# Patient Record
Sex: Female | Born: 1950
Health system: Southern US, Community
[De-identification: ages and names within clinical notes are randomized; demographics above are authoritative.]

## PROBLEM LIST (undated history)

## (undated) DIAGNOSIS — F419 Anxiety disorder, unspecified: Secondary | ICD-10-CM

## (undated) DIAGNOSIS — C50919 Malignant neoplasm of unspecified site of unspecified female breast: Secondary | ICD-10-CM

## (undated) DIAGNOSIS — K219 Gastro-esophageal reflux disease without esophagitis: Secondary | ICD-10-CM

## (undated) DIAGNOSIS — K635 Polyp of colon: Secondary | ICD-10-CM

## (undated) DIAGNOSIS — J189 Pneumonia, unspecified organism: Secondary | ICD-10-CM

## (undated) DIAGNOSIS — F329 Major depressive disorder, single episode, unspecified: Secondary | ICD-10-CM

## (undated) DIAGNOSIS — F32A Depression, unspecified: Secondary | ICD-10-CM

## (undated) DIAGNOSIS — I491 Atrial premature depolarization: Secondary | ICD-10-CM

## (undated) HISTORY — DX: Depression, unspecified: F32.A

## (undated) HISTORY — DX: Anxiety disorder, unspecified: F41.9

## (undated) HISTORY — DX: Malignant neoplasm of unspecified site of unspecified female breast: C50.919

## (undated) HISTORY — PX: GASTRIC FUNDOPLICATION: SHX226

## (undated) HISTORY — DX: Pneumonia, unspecified organism: J18.9

## (undated) HISTORY — DX: Polyp of colon: K63.5

## (undated) HISTORY — DX: Major depressive disorder, single episode, unspecified: F32.9

## (undated) HISTORY — DX: Gastro-esophageal reflux disease without esophagitis: K21.9

## (undated) HISTORY — DX: Atrial premature depolarization: I49.1

---

## 1998-05-21 ENCOUNTER — Ambulatory Visit (HOSPITAL_COMMUNITY): Admission: RE | Admit: 1998-05-21 | Discharge: 1998-05-21 | Payer: Self-pay | Admitting: Gastroenterology

## 1998-05-28 ENCOUNTER — Ambulatory Visit (HOSPITAL_COMMUNITY): Admission: RE | Admit: 1998-05-28 | Discharge: 1998-05-28 | Payer: Self-pay | Admitting: Gastroenterology

## 1998-07-31 ENCOUNTER — Ambulatory Visit (HOSPITAL_COMMUNITY): Admission: RE | Admit: 1998-07-31 | Discharge: 1998-07-31 | Payer: Self-pay | Admitting: Gastroenterology

## 1998-07-31 ENCOUNTER — Encounter: Payer: Self-pay | Admitting: Gastroenterology

## 1999-02-22 ENCOUNTER — Encounter: Payer: Self-pay | Admitting: Gastroenterology

## 1999-02-22 ENCOUNTER — Ambulatory Visit (HOSPITAL_COMMUNITY): Admission: RE | Admit: 1999-02-22 | Discharge: 1999-02-22 | Payer: Self-pay | Admitting: Gastroenterology

## 1999-06-01 ENCOUNTER — Encounter: Payer: Self-pay | Admitting: Gastroenterology

## 1999-06-01 ENCOUNTER — Ambulatory Visit (HOSPITAL_COMMUNITY): Admission: RE | Admit: 1999-06-01 | Discharge: 1999-06-01 | Payer: Self-pay | Admitting: Gastroenterology

## 1999-08-20 ENCOUNTER — Encounter: Payer: Self-pay | Admitting: Gastroenterology

## 1999-08-20 ENCOUNTER — Ambulatory Visit (HOSPITAL_COMMUNITY): Admission: RE | Admit: 1999-08-20 | Discharge: 1999-08-20 | Payer: Self-pay | Admitting: Gastroenterology

## 2000-03-21 ENCOUNTER — Encounter: Payer: Self-pay | Admitting: Gastroenterology

## 2000-03-21 ENCOUNTER — Ambulatory Visit (HOSPITAL_COMMUNITY): Admission: RE | Admit: 2000-03-21 | Discharge: 2000-03-21 | Payer: Self-pay | Admitting: Gastroenterology

## 2000-10-24 ENCOUNTER — Ambulatory Visit (HOSPITAL_COMMUNITY): Admission: RE | Admit: 2000-10-24 | Discharge: 2000-10-24 | Payer: Self-pay | Admitting: *Deleted

## 2000-10-24 ENCOUNTER — Encounter: Payer: Self-pay | Admitting: *Deleted

## 2000-12-22 ENCOUNTER — Other Ambulatory Visit: Admission: RE | Admit: 2000-12-22 | Discharge: 2000-12-22 | Payer: Self-pay | Admitting: Obstetrics and Gynecology

## 2001-09-19 ENCOUNTER — Observation Stay (HOSPITAL_COMMUNITY): Admission: EM | Admit: 2001-09-19 | Discharge: 2001-09-20 | Payer: Self-pay | Admitting: Emergency Medicine

## 2001-09-19 ENCOUNTER — Encounter: Payer: Self-pay | Admitting: Emergency Medicine

## 2001-10-10 DIAGNOSIS — C50919 Malignant neoplasm of unspecified site of unspecified female breast: Secondary | ICD-10-CM

## 2001-10-10 HISTORY — DX: Malignant neoplasm of unspecified site of unspecified female breast: C50.919

## 2001-10-10 HISTORY — PX: MASTECTOMY: SHX3

## 2002-01-11 ENCOUNTER — Ambulatory Visit (HOSPITAL_COMMUNITY): Admission: RE | Admit: 2002-01-11 | Discharge: 2002-01-11 | Payer: Self-pay | Admitting: Gastroenterology

## 2002-02-21 ENCOUNTER — Encounter: Payer: Self-pay | Admitting: Internal Medicine

## 2002-02-21 ENCOUNTER — Encounter: Admission: RE | Admit: 2002-02-21 | Discharge: 2002-02-21 | Payer: Self-pay | Admitting: Internal Medicine

## 2002-02-21 ENCOUNTER — Other Ambulatory Visit: Admission: RE | Admit: 2002-02-21 | Discharge: 2002-02-21 | Payer: Self-pay | Admitting: Radiology

## 2002-03-26 ENCOUNTER — Encounter: Payer: Self-pay | Admitting: Obstetrics and Gynecology

## 2002-03-26 ENCOUNTER — Encounter: Admission: RE | Admit: 2002-03-26 | Discharge: 2002-03-26 | Payer: Self-pay | Admitting: Obstetrics and Gynecology

## 2002-04-10 ENCOUNTER — Encounter: Admission: RE | Admit: 2002-04-10 | Discharge: 2002-04-10 | Payer: Self-pay | Admitting: Obstetrics and Gynecology

## 2002-04-10 ENCOUNTER — Encounter: Payer: Self-pay | Admitting: Obstetrics and Gynecology

## 2002-10-10 HISTORY — PX: BREAST RECONSTRUCTION: SHX9

## 2010-10-31 ENCOUNTER — Encounter: Payer: Self-pay | Admitting: Gastroenterology

## 2012-04-17 ENCOUNTER — Encounter (INDEPENDENT_AMBULATORY_CARE_PROVIDER_SITE_OTHER): Payer: 59 | Admitting: Ophthalmology

## 2012-04-17 DIAGNOSIS — H33309 Unspecified retinal break, unspecified eye: Secondary | ICD-10-CM

## 2012-04-17 DIAGNOSIS — H4310 Vitreous hemorrhage, unspecified eye: Secondary | ICD-10-CM

## 2012-04-17 DIAGNOSIS — H2510 Age-related nuclear cataract, unspecified eye: Secondary | ICD-10-CM

## 2012-04-17 DIAGNOSIS — H43819 Vitreous degeneration, unspecified eye: Secondary | ICD-10-CM

## 2012-04-20 ENCOUNTER — Encounter (INDEPENDENT_AMBULATORY_CARE_PROVIDER_SITE_OTHER): Payer: 59 | Admitting: Ophthalmology

## 2012-04-20 DIAGNOSIS — H43819 Vitreous degeneration, unspecified eye: Secondary | ICD-10-CM

## 2012-04-20 DIAGNOSIS — H33309 Unspecified retinal break, unspecified eye: Secondary | ICD-10-CM

## 2012-04-20 DIAGNOSIS — H4310 Vitreous hemorrhage, unspecified eye: Secondary | ICD-10-CM

## 2012-04-20 DIAGNOSIS — H2510 Age-related nuclear cataract, unspecified eye: Secondary | ICD-10-CM

## 2012-04-27 ENCOUNTER — Ambulatory Visit (INDEPENDENT_AMBULATORY_CARE_PROVIDER_SITE_OTHER): Payer: 59 | Admitting: Ophthalmology

## 2012-04-27 DIAGNOSIS — H4310 Vitreous hemorrhage, unspecified eye: Secondary | ICD-10-CM

## 2012-04-27 DIAGNOSIS — H33309 Unspecified retinal break, unspecified eye: Secondary | ICD-10-CM

## 2012-05-17 ENCOUNTER — Encounter (INDEPENDENT_AMBULATORY_CARE_PROVIDER_SITE_OTHER): Payer: 59 | Admitting: Ophthalmology

## 2012-05-17 DIAGNOSIS — H33309 Unspecified retinal break, unspecified eye: Secondary | ICD-10-CM

## 2012-06-01 ENCOUNTER — Encounter (INDEPENDENT_AMBULATORY_CARE_PROVIDER_SITE_OTHER): Payer: 59 | Admitting: Ophthalmology

## 2012-06-01 DIAGNOSIS — H43819 Vitreous degeneration, unspecified eye: Secondary | ICD-10-CM

## 2012-06-01 DIAGNOSIS — H2510 Age-related nuclear cataract, unspecified eye: Secondary | ICD-10-CM

## 2012-06-01 DIAGNOSIS — H4310 Vitreous hemorrhage, unspecified eye: Secondary | ICD-10-CM

## 2012-06-01 DIAGNOSIS — H33309 Unspecified retinal break, unspecified eye: Secondary | ICD-10-CM

## 2012-06-13 ENCOUNTER — Ambulatory Visit (INDEPENDENT_AMBULATORY_CARE_PROVIDER_SITE_OTHER): Payer: 59 | Admitting: Ophthalmology

## 2012-06-14 ENCOUNTER — Ambulatory Visit (INDEPENDENT_AMBULATORY_CARE_PROVIDER_SITE_OTHER): Payer: 59 | Admitting: Ophthalmology

## 2012-06-14 DIAGNOSIS — H33309 Unspecified retinal break, unspecified eye: Secondary | ICD-10-CM

## 2012-08-28 ENCOUNTER — Ambulatory Visit (INDEPENDENT_AMBULATORY_CARE_PROVIDER_SITE_OTHER): Payer: 59 | Admitting: Ophthalmology

## 2012-09-26 ENCOUNTER — Ambulatory Visit (INDEPENDENT_AMBULATORY_CARE_PROVIDER_SITE_OTHER): Payer: 59 | Admitting: Ophthalmology

## 2012-09-26 DIAGNOSIS — H2510 Age-related nuclear cataract, unspecified eye: Secondary | ICD-10-CM

## 2012-09-26 DIAGNOSIS — H33309 Unspecified retinal break, unspecified eye: Secondary | ICD-10-CM

## 2012-09-26 DIAGNOSIS — H43819 Vitreous degeneration, unspecified eye: Secondary | ICD-10-CM

## 2012-10-17 LAB — HM MAMMOGRAPHY: HM MAMMO: NEGATIVE

## 2012-10-18 ENCOUNTER — Ambulatory Visit (INDEPENDENT_AMBULATORY_CARE_PROVIDER_SITE_OTHER): Payer: 59 | Admitting: Ophthalmology

## 2012-12-05 ENCOUNTER — Ambulatory Visit (HOSPITAL_BASED_OUTPATIENT_CLINIC_OR_DEPARTMENT_OTHER)
Admission: RE | Admit: 2012-12-05 | Discharge: 2012-12-05 | Disposition: A | Discharge status: Home / Self Care | Payer: 59 | Source: Ambulatory Visit | Attending: Family Medicine | Admitting: Family Medicine

## 2012-12-05 ENCOUNTER — Other Ambulatory Visit (HOSPITAL_BASED_OUTPATIENT_CLINIC_OR_DEPARTMENT_OTHER): Payer: Self-pay | Admitting: Family Medicine

## 2012-12-05 DIAGNOSIS — J069 Acute upper respiratory infection, unspecified: Secondary | ICD-10-CM | POA: Insufficient documentation

## 2012-12-05 DIAGNOSIS — R0602 Shortness of breath: Secondary | ICD-10-CM | POA: Insufficient documentation

## 2012-12-28 ENCOUNTER — Telehealth: Payer: Self-pay | Admitting: *Deleted

## 2012-12-28 DIAGNOSIS — J189 Pneumonia, unspecified organism: Secondary | ICD-10-CM

## 2012-12-28 NOTE — Telephone Encounter (Signed)
Patient was instructed to go after 01/17/13 for her F/U CXR. PG

## 2012-12-28 NOTE — Telephone Encounter (Signed)
Holly Key called and said she needed a chest xray for pneumonia

## 2013-01-17 ENCOUNTER — Ambulatory Visit (HOSPITAL_BASED_OUTPATIENT_CLINIC_OR_DEPARTMENT_OTHER)
Admission: RE | Admit: 2013-01-17 | Discharge: 2013-01-17 | Disposition: A | Discharge status: Home / Self Care | Payer: 59 | Source: Ambulatory Visit | Attending: Family Medicine | Admitting: Family Medicine

## 2013-01-17 DIAGNOSIS — J189 Pneumonia, unspecified organism: Secondary | ICD-10-CM

## 2013-01-17 DIAGNOSIS — Z8701 Personal history of pneumonia (recurrent): Secondary | ICD-10-CM | POA: Insufficient documentation

## 2013-01-21 ENCOUNTER — Telehealth: Payer: Self-pay | Admitting: Family Medicine

## 2013-01-21 NOTE — Telephone Encounter (Signed)
Holly Key has pneumonia several weeks ago and it was recommended that she get a follow up CXR.  One was done and it was normal.

## 2013-02-14 ENCOUNTER — Telehealth: Payer: Self-pay | Admitting: *Deleted

## 2013-02-14 NOTE — Telephone Encounter (Signed)
Please let her know she has to call that office herself.  We only refer new patients.  If she has been seen there they can just make an appt without having [place] in the middle. Thanks

## 2013-02-14 NOTE — Telephone Encounter (Signed)
PT SAID SHE SEEN A UROLOGIST BACK IN 08 FOR A HIP ISSUE AND SHE HAD A SHOT FROM THEM THEN.  SHE WANTS TO SEE THEM AGAIN TO GET SAME SHOT. HIP ACTING UP AGAIN.  DR [facility] @ [facility].  PLEASE ADVISE-  # [phone]

## 2013-02-18 ENCOUNTER — Ambulatory Visit (INDEPENDENT_AMBULATORY_CARE_PROVIDER_SITE_OTHER): Payer: 59 | Admitting: Family Medicine

## 2013-02-18 VITALS — BP 119/81 | HR 76 | Ht 63.0 in | Wt 186.0 lb

## 2013-02-18 DIAGNOSIS — M76899 Other specified enthesopathies of unspecified lower limb, excluding foot: Secondary | ICD-10-CM

## 2013-02-18 DIAGNOSIS — M7072 Other bursitis of hip, left hip: Secondary | ICD-10-CM

## 2013-02-18 DIAGNOSIS — F32A Depression, unspecified: Secondary | ICD-10-CM

## 2013-02-18 DIAGNOSIS — F3289 Other specified depressive episodes: Secondary | ICD-10-CM

## 2013-02-18 DIAGNOSIS — M25552 Pain in left hip: Secondary | ICD-10-CM

## 2013-02-18 DIAGNOSIS — F329 Major depressive disorder, single episode, unspecified: Secondary | ICD-10-CM

## 2013-02-18 MED ORDER — CYCLOBENZAPRINE HCL 10 MG PO TABS: 10.0000 mg | ORAL_TABLET | Freq: Three times a day (TID) | ORAL | Status: DC | PRN

## 2013-02-18 MED ORDER — KETOROLAC TROMETHAMINE 60 MG/2ML IM SOLN
60.0000 mg | Freq: Once | INTRAMUSCULAR | Status: AC
  Administered 2013-02-18: 60 mg via INTRAMUSCULAR

## 2013-02-18 MED ORDER — FLUOXETINE HCL 20 MG PO CAPS: 20.0000 mg | ORAL_CAPSULE | Freq: Every day | ORAL | Status: DC

## 2013-02-18 MED ORDER — DICLOFENAC 35 MG PO CAPS: 1.0000 | ORAL_CAPSULE | Freq: Three times a day (TID) | ORAL | Status: DC

## 2013-02-18 MED ORDER — MELOXICAM 7.5 MG PO TABS: 7.5000 mg | ORAL_TABLET | Freq: Every day | ORAL | Status: DC

## 2013-02-18 MED ORDER — LIDOCAINE 5 % EX PTCH: MEDICATED_PATCH | CUTANEOUS | Status: DC

## 2013-02-18 NOTE — Progress Notes (Signed)
  Subjective:    Patient ID: Holly Key, female    DOB: [birth year], 62 y.o.   MRN: [id number]  Preston is here today to discuss a few issues:    1)  Left Hip Pain:  She has seen Dr. Quentin Mulling in the past for similar pain. She was told that she had trouble with her Piriformis muscle.      Hip Pain  The incident occurred more than 1 week ago. The incident occurred in the yard. The injury mechanism was a twisting injury. The pain is present in the left hip. The quality of the pain is described as aching, shooting and stabbing. The pain is at a severity of 8/10 (During the day the pain mild with Ibuprofen; at night it is severe ). The pain is severe. The pain has been constant since onset. Pertinent negatives include no inability to bear weight, loss of motion, loss of sensation, muscle weakness, numbness or tingling. The symptoms are aggravated by movement. She has tried NSAIDs, heat and rest (Flexeril ) for the symptoms. The treatment provided mild relief.   2)  Mood:  She has done well on Prozac.  She needs a refill on it.     Review of Systems  Musculoskeletal: Positive for arthralgias (Left Hip Pain ).  Neurological: Negative for tingling, weakness and numbness.  Psychiatric/Behavioral: Negative for dysphoric mood. The patient is not nervous/anxious.    Past Medical History  Diagnosis Date  . Breast cancer 2003    Right Breast   . Atrial premature complex   . Colon polyp   . Depression   . Anxiety    Family History  Problem Relation Age of Onset  . Heart disease Mother   . Cancer Mother     Mother  . Cancer Father     Kidney  . 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   She has never smoked.     Alcohol Use:  Occasional   Drug Use:  None   Diet:  Regular   Exercise:  None    Hobbies: Reading, Gardening               Objective:   Physical Exam  Constitutional: She appears well-nourished. No distress.  Cardiovascular: Normal rate and regular rhythm.   Pulmonary/Chest: Effort normal and breath sounds normal.  Musculoskeletal:       Left hip: She exhibits tenderness. She exhibits normal strength and no swelling.  Psychiatric: She has a normal mood and affect. Her speech is normal and behavior is normal. Judgment and thought content normal. Cognition and memory are normal.      Assessment & Plan:

## 2013-03-03 ENCOUNTER — Encounter: Payer: Self-pay | Admitting: Family Medicine

## 2013-03-03 DIAGNOSIS — F32A Depression, unspecified: Secondary | ICD-10-CM | POA: Insufficient documentation

## 2013-03-03 DIAGNOSIS — M7070 Other bursitis of hip, unspecified hip: Secondary | ICD-10-CM | POA: Insufficient documentation

## 2013-03-03 DIAGNOSIS — F329 Major depressive disorder, single episode, unspecified: Secondary | ICD-10-CM | POA: Insufficient documentation

## 2013-03-03 DIAGNOSIS — M25559 Pain in unspecified hip: Secondary | ICD-10-CM | POA: Insufficient documentation

## 2013-03-03 NOTE — Assessment & Plan Note (Signed)
She received an injection of Kenalog into her left hip bursa.

## 2013-03-03 NOTE — Assessment & Plan Note (Signed)
Refilled her Prozac.   

## 2013-03-29 ENCOUNTER — Encounter: Payer: Self-pay | Admitting: Family Medicine

## 2013-03-29 ENCOUNTER — Ambulatory Visit (INDEPENDENT_AMBULATORY_CARE_PROVIDER_SITE_OTHER): Payer: 59 | Admitting: Family Medicine

## 2013-03-29 VITALS — BP 125/85 | HR 76 | Temp 98.4°F | Resp 20

## 2013-03-29 DIAGNOSIS — H6121 Impacted cerumen, right ear: Secondary | ICD-10-CM

## 2013-03-29 DIAGNOSIS — H6983 Other specified disorders of Eustachian tube, bilateral: Secondary | ICD-10-CM

## 2013-03-29 DIAGNOSIS — H6980 Other specified disorders of Eustachian tube, unspecified ear: Secondary | ICD-10-CM

## 2013-03-29 DIAGNOSIS — H6120 Impacted cerumen, unspecified ear: Secondary | ICD-10-CM

## 2013-03-29 DIAGNOSIS — H60399 Other infective otitis externa, unspecified ear: Secondary | ICD-10-CM

## 2013-03-29 DIAGNOSIS — H6993 Unspecified Eustachian tube disorder, bilateral: Secondary | ICD-10-CM

## 2013-03-29 DIAGNOSIS — H6990 Unspecified Eustachian tube disorder, unspecified ear: Secondary | ICD-10-CM

## 2013-03-29 DIAGNOSIS — H60393 Other infective otitis externa, bilateral: Secondary | ICD-10-CM

## 2013-03-29 MED ORDER — DICLOFENAC 18 MG PO CAPS: 1.0000 | ORAL_CAPSULE | Freq: Three times a day (TID) | ORAL | Status: DC

## 2013-03-29 MED ORDER — PRAMOXINE-HC-CHLOROXYLENOL AQ 10-10-1 MG/ML OT SOLN: 3.0000 [drp] | Freq: Three times a day (TID) | OTIC | Status: DC

## 2013-03-29 MED ORDER — CIPROFLOXACIN-DEXAMETHASONE 0.3-0.1 % OT SUSP: 4.0000 [drp] | Freq: Two times a day (BID) | OTIC | Status: AC

## 2013-03-29 MED ORDER — CEFDINIR 300 MG PO CAPS: 300.0000 mg | ORAL_CAPSULE | Freq: Two times a day (BID) | ORAL | Status: AC

## 2013-03-29 NOTE — Patient Instructions (Addendum)
1)  Otitis Externa - Ciprodex (Put 4 drops in each ear 2 times per day for 7 days);  If your ear worsens, you can start on the Omnicef 300 mg twice a day for 10 days.  The Cortane B is to use after this ear infection clears.    Otitis Externa Otitis externa is a bacterial or fungal infection of the outer ear canal. This is the area from the eardrum to the outside of the ear. Otitis externa is sometimes called "swimmer's ear." CAUSES  Possible causes of infection include:  Swimming in dirty water.  Moisture remaining in the ear after swimming or bathing.  Mild injury (trauma) to the ear.  Objects stuck in the ear (foreign body).  Cuts or scrapes (abrasions) on the outside of the ear. SYMPTOMS  The first symptom of infection is often itching in the ear canal. Later signs and symptoms may include swelling and redness of the ear canal, ear pain, and yellowish-white fluid (pus) coming from the ear. The ear pain may be worse when pulling on the earlobe. DIAGNOSIS  Your caregiver will perform a physical exam. A sample of fluid may be taken from the ear and examined for bacteria or fungi. TREATMENT  Antibiotic ear drops are often given for 10 to 14 days. Treatment may also include pain medicine or corticosteroids to reduce itching and swelling. PREVENTION   Keep your ear dry. Use the corner of a towel to absorb water out of the ear canal after swimming or bathing.  Avoid scratching or putting objects inside your ear. This can damage the ear canal or remove the protective wax that lines the canal. This makes it easier for bacteria and fungi to grow.  Avoid swimming in lakes, polluted water, or poorly chlorinated pools.  You may use ear drops made of rubbing alcohol and vinegar after swimming. Combine equal parts of white vinegar and alcohol in a bottle. Put 3 or 4 drops into each ear after swimming. HOME CARE INSTRUCTIONS   Apply antibiotic ear drops to the ear canal as prescribed by your  caregiver.  Only take over-the-counter or prescription medicines for pain, discomfort, or fever as directed by your caregiver.  If you have diabetes, follow any additional treatment instructions from your caregiver.  Keep all follow-up appointments as directed by your caregiver. SEEK MEDICAL CARE IF:   You have a fever.  Your ear is still red, swollen, painful, or draining pus after 3 days.  Your redness, swelling, or pain gets worse.  You have a severe headache.  You have redness, swelling, pain, or tenderness in the area behind your ear. MAKE SURE YOU:   Understand these instructions.  Will watch your condition.  Will get help right away if you are not doing well or get worse. Document Released: 09/26/2005 Document Revised: 12/19/2011 Document Reviewed: 10/13/2011 [facility] Patient Information 2014 [place], [place].

## 2013-04-08 ENCOUNTER — Other Ambulatory Visit: Payer: Self-pay | Admitting: *Deleted

## 2013-04-08 DIAGNOSIS — Z0000 Encounter for general adult medical examination without abnormal findings: Secondary | ICD-10-CM

## 2013-04-08 DIAGNOSIS — E559 Vitamin D deficiency, unspecified: Secondary | ICD-10-CM

## 2013-04-09 ENCOUNTER — Other Ambulatory Visit: Payer: 59 | Admitting: *Deleted

## 2013-04-10 LAB — COMPREHENSIVE METABOLIC PANEL
ALT: 18 IU/L (ref 0–32)
AST: 14 IU/L (ref 0–40)
Albumin/Globulin Ratio: 2.3 (ref 1.1–2.5)
Albumin: 4.6 g/dL (ref 3.6–4.8)
Alkaline Phosphatase: 67 IU/L (ref 39–117)
BUN/Creatinine Ratio: 23 (ref 11–26)
BUN: 15 mg/dL (ref 8–27)
CO2: 25 mmol/L (ref 18–29)
Calcium: 9.2 mg/dL (ref 8.6–10.2)
Chloride: 103 mmol/L (ref 97–108)
Creatinine, Ser: 0.65 mg/dL (ref 0.57–1.00)
GFR calc Af Amer: 109 mL/min/{1.73_m2} (ref >59)
GFR calc non Af Amer: 95 mL/min/{1.73_m2} (ref >59)
Globulin, Total: 2 g/dL (ref 1.5–4.5)
Glucose: 91 mg/dL (ref 65–99)
Potassium: 4.7 mmol/L (ref 3.5–5.2)
Sodium: 143 mmol/L (ref 134–144)
Total Bilirubin: 0.7 mg/dL (ref 0.0–1.2)
Total Protein: 6.6 g/dL (ref 6.0–8.5)

## 2013-04-10 LAB — CBC WITH DIFFERENTIAL/PLATELET
Basophils Absolute: 0 10*3/uL (ref 0.0–0.2)
Basos: 1 % (ref 0–3)
Eos: 2 % (ref 0–5)
Eosinophils Absolute: 0.1 10*3/uL (ref 0.0–0.4)
HCT: 43.2 % (ref 34.0–46.6)
Hemoglobin: 14.7 g/dL (ref 11.1–15.9)
Immature Grans (Abs): 0 10*3/uL (ref 0.0–0.1)
Immature Granulocytes: 0 % (ref 0–2)
Lymphocytes Absolute: 2.2 10*3/uL (ref 0.7–3.1)
Lymphs: 34 % (ref 14–46)
MCH: 33.9 pg — ABNORMAL HIGH (ref 26.6–33.0)
MCHC: 34 g/dL (ref 31.5–35.7)
MCV: 100 fL — ABNORMAL HIGH (ref 79–97)
Monocytes Absolute: 0.3 10*3/uL (ref 0.1–0.9)
Monocytes: 5 % (ref 4–12)
Neutrophils Absolute: 3.8 10*3/uL (ref 1.4–7.0)
Neutrophils Relative %: 58 % (ref 40–74)
RBC: 4.34 x10E6/uL (ref 3.77–5.28)
RDW: 14.3 % (ref 12.3–15.4)
WBC: 6.4 10*3/uL (ref 3.4–10.8)

## 2013-04-10 LAB — VITAMIN D 25 HYDROXY (VIT D DEFICIENCY, FRACTURES): Vit D, 25-Hydroxy: 27.2 ng/mL — ABNORMAL LOW (ref 30.0–100.0)

## 2013-04-10 LAB — LIPID PANEL
Chol/HDL Ratio: 2.2 ratio units (ref 0.0–4.4)
Cholesterol, Total: 182 mg/dL (ref 100–199)
HDL: 81 mg/dL (ref >39)
LDL Calculated: 83 mg/dL (ref 0–99)
Triglycerides: 89 mg/dL (ref 0–149)
VLDL Cholesterol Cal: 18 mg/dL (ref 5–40)

## 2013-04-10 LAB — TSH: TSH: 2.28 u[IU]/mL (ref 0.450–4.500)

## 2013-04-15 ENCOUNTER — Encounter: Payer: Self-pay | Admitting: Family Medicine

## 2013-04-15 ENCOUNTER — Ambulatory Visit (INDEPENDENT_AMBULATORY_CARE_PROVIDER_SITE_OTHER): Payer: 59 | Admitting: Family Medicine

## 2013-04-15 VITALS — BP 120/86 | HR 85 | Wt 191.0 lb

## 2013-04-15 DIAGNOSIS — F32A Depression, unspecified: Secondary | ICD-10-CM

## 2013-04-15 DIAGNOSIS — F329 Major depressive disorder, single episode, unspecified: Secondary | ICD-10-CM

## 2013-04-15 DIAGNOSIS — M26609 Unspecified temporomandibular joint disorder, unspecified side: Secondary | ICD-10-CM | POA: Insufficient documentation

## 2013-04-15 DIAGNOSIS — F3289 Other specified depressive episodes: Secondary | ICD-10-CM

## 2013-04-15 DIAGNOSIS — E785 Hyperlipidemia, unspecified: Secondary | ICD-10-CM | POA: Insufficient documentation

## 2013-04-15 DIAGNOSIS — E559 Vitamin D deficiency, unspecified: Secondary | ICD-10-CM

## 2013-04-15 MED ORDER — ERGOCALCIFEROL 1.25 MG (50000 UT) PO CAPS: ORAL_CAPSULE | ORAL | Status: DC

## 2013-04-15 MED ORDER — ROSUVASTATIN CALCIUM 40 MG PO TABS: 40.0000 mg | ORAL_TABLET | Freq: Every day | ORAL | Status: DC

## 2013-04-15 MED ORDER — FLUOXETINE HCL 20 MG PO CAPS: 20.0000 mg | ORAL_CAPSULE | Freq: Every day | ORAL | Status: DC

## 2013-04-15 NOTE — Assessment & Plan Note (Signed)
Refilled her Prozac.   

## 2013-04-15 NOTE — Assessment & Plan Note (Signed)
Her ears look perfectly normal today so she may be having TMJ symptoms.  She is to take a Flexeril at night and is to apply some Voltaren Gel. If her ears continue to bother her, she will F/U with [facility].

## 2013-04-15 NOTE — Patient Instructions (Addendum)
1)  Hyperlipidemia -  Lipid panel is perfect on 10 mg of Crestor so stay on this dosage (1/4 of 40 mg)   2)  Ear Discomfort - ? Eustachian Tube Dysfunction vs TMJ.  She will try taking the Flexeril nightly and apply Voltaren Gel.  If this does not improve things for you then F/U with [facility].    3)  Fatigue - Try Vitamin B12 - 1 ml every 2 - 4 weeks.    Temporomandibular Problems  Temporomandibular joint (TMJ) dysfunction means there are problems with the joint between your jaw and your skull. This is a joint lined by cartilage like other joints in your body but also has a small disc in the joint which keeps the bones from rubbing on each other. These joints are like other joints and can get inflamed (sore) from arthritis and other problems. When this joint gets sore, it can cause headaches and pain in the jaw and the face. CAUSES  Usually the arthritic types of problems are caused by soreness in the joint. Soreness in the joint can also be caused by overuse. This may come from grinding your teeth. It may also come from mis-alignment in the joint. DIAGNOSIS Diagnosis of this condition can often be made by history and exam. Sometimes your caregiver may need X-rays or an MRI scan to determine the exact cause. It may be necessary to see your dentist to determine if your teeth and jaws are lined up correctly. TREATMENT  Most of the time this problem is not serious; however, sometimes it can persist (become chronic). When this happens medications that will cut down on inflammation (soreness) help. Sometimes a shot of cortisone into the joint will be helpful. If your teeth are not aligned it may help for your dentist to make a splint for your mouth that can help this problem. If no physical problems can be found, the problem may come from tension. If tension is found to be the cause, biofeedback or relaxation techniques may be helpful. HOME CARE INSTRUCTIONS   Later in the day, applications of ice packs may  be helpful. Ice can be used in a plastic bag with a towel around it to prevent frostbite to skin. This may be used about every 2 hours for 20 to 30 minutes, as needed while awake, or as directed by your caregiver.  Only take over-the-counter or prescription medicines for pain, discomfort, or fever as directed by your caregiver.  If physical therapy was prescribed, follow your caregiver's directions.  Wear mouth appliances as directed if they were given. Document Released: 06/21/2001 Document Revised: 12/19/2011 Document Reviewed: 09/28/2008 [facility] Patient Information 2014 [place], [place].

## 2013-04-15 NOTE — Progress Notes (Signed)
  Subjective:    Patient ID: Holly Key, female    DOB: 1951-04-17, 62 y.o.   MRN: [id number]  HPI  Holly Key is here today complaining of ear pain/pressure R > L.  She bought an OTC ear wax removal kit but has not been able to get anything out of her ears.  She has been taking a decongestant which has not helped her very much.    Review of Systems  Constitutional: Positive for fatigue. Negative for fever and chills.  HENT: Positive for hearing loss, ear pain, congestion, sore throat, sinus pressure and tinnitus. Negative for rhinorrhea, trouble swallowing, neck pain, neck stiffness, voice change, postnasal drip and ear discharge.        Ears feel blocked R >L  Eyes: Negative.   Cardiovascular: Negative.   Gastrointestinal: Negative.   Endocrine: Negative.   Musculoskeletal: Negative.   Skin: Negative for rash.  Neurological: Positive for headaches.  Hematological: Negative for adenopathy.       Objective:   Physical Exam  Constitutional: She appears well-nourished. No distress.  HENT:  Head: Normocephalic.  Mouth/Throat: No oropharyngeal exudate.  Ear canals are full of cerumen R > L . They appeared to be inflamed after cerumen was removed.    Eyes: Conjunctivae are normal. Right eye exhibits no discharge. Left eye exhibits no discharge.  Neck: Neck supple.  Cardiovascular: Normal rate, regular rhythm and normal heart sounds.  Exam reveals no gallop and no friction rub.   No murmur heard. Pulmonary/Chest: Effort normal and breath sounds normal. She has no wheezes. She exhibits no tenderness.  Lymphadenopathy:    She has no cervical adenopathy.  Neurological: She is alert.  Skin: Skin is warm and dry. No rash noted.  Psychiatric: She has a normal mood and affect.          Assessment & Plan:

## 2013-04-15 NOTE — Assessment & Plan Note (Signed)
Lipid panel is perfect on 10 mg of Crestor.

## 2013-04-15 NOTE — Assessment & Plan Note (Signed)
Her Vitamin D level remains low.  She is to increase her dosage to 50.000 twice a week.

## 2013-04-15 NOTE — Progress Notes (Signed)
  Subjective:    Patient ID: Holly Key, female    DOB: 1951/02/05, 62 y.o.   MRN: [id number]  HPI  Holly Key is here today to go over her most recent lab results, discuss the issues below and get medication refills.    1)  Ear Pain/Pressure:  She continues to struggle with this problem. She used the ear drops and oral antibiotics prescribed to her the end of June.  The problem seemed to be better when she was on the oral antibiotic.  She says that her head feels fine when she is upright.  Her issues happen at night when he is lying down.  It feels that she has fluid in her ear and it throbs if she is lying on that side. She has started back on Allegra D which has not really helped her symptoms.    2)  Hyperlipidemia:  She is doing well on a combination of Fish Oil and Crestor   3)  Mood:  Her mood is good on Prozac.  4)  Fatigue:  She continues to feel fatigued.    5)  Vitamin D:  She takes 50,000 IU every 2 weeks.    Review of Systems  HENT: Positive for ear pain.    Past Medical History  Diagnosis Date  . Breast cancer 2003    Right Breast   . Atrial premature complex   . Colon polyp   . Depression   . Anxiety    Family History  Problem Relation Age of Onset  . Heart disease Mother   . Cancer Mother     Mother  . Cancer Father     Kidney  . 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  She has never smoked.     Alcohol Use:  Occasional   Drug Use:  None   Diet:  Regular   Exercise:  None    Hobbies: Reading, Gardening               Objective:   Physical Exam  Constitutional: She appears well-nourished. No distress.  HENT:  Head: Normocephalic.  Nose:  Nose normal.  Ear Canals & TMs are normal   Eyes: Conjunctivae are normal.  Neck: Neck supple.  Cardiovascular: Normal rate, regular rhythm and normal heart sounds.  Exam reveals no gallop and no friction rub.   No murmur heard. Pulmonary/Chest: Effort normal and breath sounds normal.  Neurological: She is alert.  Skin: Skin is warm and dry. No rash noted.  Psychiatric: She has a normal mood and affect.          Assessment & Plan:

## 2013-04-20 DIAGNOSIS — H6980 Other specified disorders of Eustachian tube, unspecified ear: Secondary | ICD-10-CM | POA: Insufficient documentation

## 2013-04-20 DIAGNOSIS — H6121 Impacted cerumen, right ear: Secondary | ICD-10-CM | POA: Insufficient documentation

## 2013-04-20 DIAGNOSIS — H60399 Other infective otitis externa, unspecified ear: Secondary | ICD-10-CM | POA: Insufficient documentation

## 2013-04-20 DIAGNOSIS — H6990 Unspecified Eustachian tube disorder, unspecified ear: Secondary | ICD-10-CM | POA: Insufficient documentation

## 2013-04-20 NOTE — Assessment & Plan Note (Addendum)
She was given a prescription for Ciprodex to try first.  If her ears continue to feel blocked, she will then take the [facility].  She is to use Cortane B in the future to prevent issues with her ear canals.

## 2013-04-20 NOTE — Assessment & Plan Note (Signed)
Her right ear was flushed using an cerumen removal kit with warm water and peroxide along with an ear scoop.

## 2013-04-20 NOTE — Assessment & Plan Note (Signed)
She is to try some Allegra D 60/120 twice a day.

## 2013-07-18 ENCOUNTER — Ambulatory Visit (INDEPENDENT_AMBULATORY_CARE_PROVIDER_SITE_OTHER): Payer: 59 | Admitting: Family Medicine

## 2013-07-18 ENCOUNTER — Encounter: Payer: Self-pay | Admitting: Family Medicine

## 2013-07-18 ENCOUNTER — Ambulatory Visit (HOSPITAL_BASED_OUTPATIENT_CLINIC_OR_DEPARTMENT_OTHER)
Admission: RE | Admit: 2013-07-18 | Discharge: 2013-07-18 | Disposition: A | Discharge status: Home / Self Care | Payer: 59 | Source: Ambulatory Visit | Attending: Family Medicine | Admitting: Family Medicine

## 2013-07-18 VITALS — BP 137/86 | HR 99 | Resp 16 | Ht 63.0 in | Wt 188.0 lb

## 2013-07-18 DIAGNOSIS — R1013 Epigastric pain: Secondary | ICD-10-CM | POA: Insufficient documentation

## 2013-07-18 DIAGNOSIS — Z23 Encounter for immunization: Secondary | ICD-10-CM

## 2013-07-18 DIAGNOSIS — K219 Gastro-esophageal reflux disease without esophagitis: Secondary | ICD-10-CM

## 2013-07-18 DIAGNOSIS — E559 Vitamin D deficiency, unspecified: Secondary | ICD-10-CM

## 2013-07-18 DIAGNOSIS — R109 Unspecified abdominal pain: Secondary | ICD-10-CM

## 2013-07-18 LAB — POCT URINALYSIS DIPSTICK
Bilirubin, UA: NEGATIVE
Blood, UA: NEGATIVE
Glucose, UA: NEGATIVE
Ketones, UA: NEGATIVE
Leukocytes, UA: NEGATIVE
Nitrite, UA: NEGATIVE
Protein, UA: NEGATIVE
Spec Grav, UA: 1.015
Urobilinogen, UA: NEGATIVE
pH, UA: 6.5

## 2013-07-18 MED ORDER — RABEPRAZOLE SODIUM 20 MG PO TBEC: 20.0000 mg | DELAYED_RELEASE_TABLET | Freq: Every day | ORAL | Status: DC

## 2013-07-18 NOTE — Progress Notes (Signed)
Subjective:    Patient ID: Holly Key, female    DOB: [birth year], 62 y.o.   MRN: [id number]  Holly Key is here today complaining of abdominal pain.    Abdominal Pain This is a new problem. The current episode started 1 to 4 weeks ago. The onset quality is gradual. The problem occurs intermittently. The problem has been unchanged. The pain is located in the epigastric region. The pain is at a severity of 8/10. The pain is moderate. The quality of the pain is sharp and cramping. The abdominal pain radiates to the epigastric region. Associated symptoms include diarrhea and nausea. Nothing aggravates the pain. The pain is relieved by nothing. Holly Key has tried antacids for the symptoms. The treatment provided no relief. Holly Key past medical history is significant for GERD and irritable bowel syndrome.     Review of Systems  Constitutional: Positive for fatigue.  HENT: Negative.   Eyes: Negative.   Respiratory: Negative.   Cardiovascular: Negative.   Gastrointestinal: Positive for nausea, abdominal pain and diarrhea.  Endocrine: Negative.   Genitourinary: Negative.   Musculoskeletal: Negative.   Skin: Negative.   Allergic/Immunologic: Negative.   Neurological: Negative.   Hematological: Negative.   Psychiatric/Behavioral: Negative.      Past Medical History  Diagnosis Date  . Breast cancer 2003    Right Breast   . Atrial premature complex   . Colon polyp   . Depression   . Anxiety      Family History  Problem Relation Age of Onset  . Heart disease Mother   . Cancer Mother     Mother  . Cancer Father     Kidney  . 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  Holly Key has  never smoked.     Alcohol Use:  Occasional   Drug Use:  None   Diet:  Regular   Exercise:  None    Hobbies: Reading, Gardening             Allergies  Allergen Reactions  . Erythromycin Diarrhea  . Septra [Sulfamethoxazole-Tmp Ds] Rash    Current Outpatient Prescriptions on File Prior to Visit  Medication Sig Dispense Refill  . fish oil-omega-3 fatty acids 1000 MG capsule Take 2 g by mouth daily.      Marland Kitchen FLUoxetine (PROZAC) 20 MG capsule Take 1 capsule (20 mg total) by mouth daily.  90 capsule  1  . rosuvastatin (CRESTOR) 40 MG tablet Take 1 tablet (40 mg total) by mouth daily.  90 tablet  1   No current facility-administered medications on file prior to visit.      Objective:   Physical Exam  Nursing note and vitals reviewed. Constitutional: Holly Key is oriented to person, place, and time. Holly Key appears well-developed and well-nourished.  HENT:  Head: Normocephalic.  Eyes: Pupils are equal, round, and reactive to light.  Neck: Normal range of motion.  Cardiovascular: Normal rate.   Pulmonary/Chest: Effort normal.  Abdominal: Soft.  Neurological: Holly Key is alert and oriented to person, place, and time.  Skin: Skin is warm and dry.  Psychiatric: Holly Key has a normal mood and  affect. Holly Key behavior is normal. Judgment and thought content normal.      Assessment & Plan:    [name] was seen today for abdominal pain.  Diagnoses and associated orders for this visit:  Abdominal pain, unspecified site - POCT urinalysis dipstick - US Abdomen Complete - Normal; we'll send for a Hida Scan.    Need for prophylactic vaccination and inoculation against influenza - Flu Vaccine QUAD 36+ mos PF IM (Fluarix)  GERD (gastroesophageal reflux disease) - RABEprazole (ACIPHEX) 20 MG tablet; Take 1 tablet (20 mg total) by mouth daily.  Unspecified vitamin D deficiency  Holly Key is doing fine on Holly Key 50,000 IU weekly.    TIME SPENT "FACE TO FACE" WITH PATIENT -  30 MINS

## 2013-07-19 ENCOUNTER — Telehealth: Payer: Self-pay | Admitting: *Deleted

## 2013-07-19 NOTE — Telephone Encounter (Signed)
Holly Key is aware her Gery Pray Scan is scheduled for 07/26/13 at 9:30 am at [facility].  Her insurance did not require pre-certification. PG

## 2013-09-12 DIAGNOSIS — Z23 Encounter for immunization: Secondary | ICD-10-CM | POA: Insufficient documentation

## 2013-09-12 NOTE — Assessment & Plan Note (Signed)
The patient confirmed that they are not allergic to eggs and have never had a bad reaction with the flu shot in the past.  The vaccination was given without difficulty.   

## 2013-09-22 NOTE — Addendum Note (Signed)
Addended by: Birdena Jubilee on: 09/22/2013 04:13 PM   Modules accepted: Level of Service

## 2013-10-14 ENCOUNTER — Encounter: Payer: Self-pay | Admitting: *Deleted

## 2013-10-18 ENCOUNTER — Encounter: Payer: Self-pay | Admitting: *Deleted

## 2013-10-24 ENCOUNTER — Telehealth: Payer: Self-pay | Admitting: *Deleted

## 2013-10-24 DIAGNOSIS — F32A Depression, unspecified: Secondary | ICD-10-CM

## 2013-10-24 DIAGNOSIS — F329 Major depressive disorder, single episode, unspecified: Secondary | ICD-10-CM

## 2013-10-24 MED ORDER — FLUOXETINE HCL 20 MG PO CAPS: 20.0000 mg | ORAL_CAPSULE | Freq: Every day | ORAL | Status: DC

## 2013-10-24 NOTE — Telephone Encounter (Signed)
Will you please refill Prozac for 30 days? She is out and I have made her an appt already.  [facility] uses [facility] [address].

## 2013-10-24 NOTE — Telephone Encounter (Signed)
done

## 2013-11-07 ENCOUNTER — Ambulatory Visit (INDEPENDENT_AMBULATORY_CARE_PROVIDER_SITE_OTHER): Payer: 59 | Admitting: Family Medicine

## 2013-11-07 ENCOUNTER — Ambulatory Visit (HOSPITAL_BASED_OUTPATIENT_CLINIC_OR_DEPARTMENT_OTHER)
Admission: RE | Admit: 2013-11-07 | Discharge: 2013-11-07 | Disposition: A | Discharge status: Home / Self Care | Payer: 59 | Source: Ambulatory Visit | Attending: Family Medicine | Admitting: Family Medicine

## 2013-11-07 ENCOUNTER — Encounter: Payer: Self-pay | Admitting: Family Medicine

## 2013-11-07 ENCOUNTER — Encounter (INDEPENDENT_AMBULATORY_CARE_PROVIDER_SITE_OTHER): Payer: Self-pay

## 2013-11-07 VITALS — BP 120/71 | HR 76 | Resp 16 | Ht 62.5 in | Wt 190.0 lb

## 2013-11-07 DIAGNOSIS — R059 Cough, unspecified: Secondary | ICD-10-CM

## 2013-11-07 DIAGNOSIS — Z23 Encounter for immunization: Secondary | ICD-10-CM

## 2013-11-07 DIAGNOSIS — F3289 Other specified depressive episodes: Secondary | ICD-10-CM

## 2013-11-07 DIAGNOSIS — T148XXA Other injury of unspecified body region, initial encounter: Secondary | ICD-10-CM

## 2013-11-07 DIAGNOSIS — F329 Major depressive disorder, single episode, unspecified: Secondary | ICD-10-CM

## 2013-11-07 DIAGNOSIS — F32A Depression, unspecified: Secondary | ICD-10-CM

## 2013-11-07 DIAGNOSIS — E559 Vitamin D deficiency, unspecified: Secondary | ICD-10-CM

## 2013-11-07 DIAGNOSIS — R05 Cough: Secondary | ICD-10-CM

## 2013-11-07 DIAGNOSIS — R0602 Shortness of breath: Secondary | ICD-10-CM | POA: Insufficient documentation

## 2013-11-07 MED ORDER — MUPIROCIN 2 % EX OINT: TOPICAL_OINTMENT | CUTANEOUS | Status: DC

## 2013-11-07 MED ORDER — ERGOCALCIFEROL 1.25 MG (50000 UT) PO CAPS: 50000.0000 [IU] | ORAL_CAPSULE | ORAL | Status: AC

## 2013-11-07 MED ORDER — FLUOXETINE HCL 20 MG PO CAPS: 20.0000 mg | ORAL_CAPSULE | Freq: Every day | ORAL | Status: DC

## 2013-11-07 NOTE — Progress Notes (Signed)
Subjective:    Patient ID: Holly Key, female    DOB: 1950-11-21, 63 y.o.   MRN: [id number]  Holly Key is here today complaining of cough, mid back pain and shortness of breath. She also needs to have several of her medications refilled.    Cough The current episode started 1 to 4 weeks ago. The problem has been waxing and waning. The cough is productive of sputum. Associated symptoms include chest pain, nasal congestion and shortness of breath. Treatments tried: [place]ka Cold-Care,  Allegra-D, Saline  The treatment provided mild relief. Her past medical history is significant for pneumonia.    Mood - Her mood is stable on Prozac.  She would like to remain on this.    Vitamin D Deficiency - She needs to have her Vitamin D refilled.    Review of Systems  HENT: Positive for voice change.        Facial pressure  Respiratory: Positive for cough and shortness of breath.   Cardiovascular: Positive for chest pain.     Past Medical History  Diagnosis Date  . Breast cancer 2003    Right Breast   . Atrial premature complex   . Colon polyp   . Depression   . Anxiety   . GERD (gastroesophageal reflux disease)   . Pneumonia      Past Surgical History  Procedure Laterality Date  . Mastectomy Right 2003  . Breast reconstruction Right 2004  . 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  She has never smoked.     Alcohol Use:  Occasional   Drug Use:  None   Diet:  Regular   Exercise:  None    Hobbies: Reading, Gardening                 Family History  Problem Relation Age of Onset  . Heart disease Mother   . Cancer Mother     Mother  . Cancer  Father     Kidney  . Heart disease Brother      Current Outpatient Prescriptions on File Prior to Visit  Medication Sig Dispense Refill  . fish oil-omega-3 fatty acids 1000 MG capsule Take 2 g by mouth daily.      . rosuvastatin (CRESTOR) 40 MG tablet Take 1 tablet (40 mg total) by mouth daily.  90 tablet  1   No current facility-administered medications on file prior to visit.     Allergies  Allergen Reactions  . Erythromycin Diarrhea  . Septra [Sulfamethoxazole-Tmp Ds] Rash     Immunization History  Administered Date(s) Administered  . Influenza,inj,Quad PF,36+ Mos 07/18/2013  . Pneumococcal Conjugate-13 11/07/2013  . Pneumococcal Polysaccharide-23 08/29/2011  . Tdap 02/22/2007  . Zoster 02/15/2012       Objective:   Physical Exam  Constitutional: She appears well-nourished. No distress.  HENT:  Head: Normocephalic.  Mouth/Throat: No oropharyngeal exudate.  Eyes: Conjunctivae are normal. Right eye exhibits no discharge. Left eye exhibits no discharge.  Neck: Neck supple.  Cardiovascular: Normal rate, regular rhythm and normal heart sounds.  Exam reveals no gallop and no  friction rub.   No murmur heard. Pulmonary/Chest: Effort normal and breath sounds normal. She has no wheezes. She exhibits no tenderness.  Lymphadenopathy:    She has no cervical adenopathy.  Neurological: She is alert.  Skin: Skin is warm and dry. No rash noted.  Psychiatric: She has a normal mood and affect.      Assessment & Plan:    Holly Key was seen today for back pain and cough.  Diagnoses and associated orders for this visit:  Cough -     DG Chest 2 View (No active cardiopulmonary disease) - Pneumococcal conjugate vaccine 13-valent  Wound, open - mupirocin ointment (BACTROBAN) 2 %; Apply to skin 2-3 times per day  Unspecified vitamin D deficiency - ergocalciferol (VITAMIN D2) 50000 UNITS capsule; Take 1 capsule (50,000 Units total) by mouth once a week.  Depression - FLUoxetine  (PROZAC) 20 MG capsule; Take 1 capsule (20 mg total) by mouth daily.  Need for prophylactic vaccination against Streptococcus pneumoniae (pneumococcus) - Pneumococcal conjugate vaccine 13-valent

## 2013-11-12 ENCOUNTER — Ambulatory Visit: Payer: 59 | Admitting: Family Medicine

## 2013-11-23 ENCOUNTER — Other Ambulatory Visit: Payer: Self-pay | Admitting: Family Medicine

## 2014-01-05 DIAGNOSIS — Z23 Encounter for immunization: Secondary | ICD-10-CM | POA: Insufficient documentation

## 2014-02-17 ENCOUNTER — Ambulatory Visit (INDEPENDENT_AMBULATORY_CARE_PROVIDER_SITE_OTHER): Payer: 59 | Admitting: Family Medicine

## 2014-02-17 ENCOUNTER — Encounter: Payer: Self-pay | Admitting: Family Medicine

## 2014-02-17 VITALS — BP 135/84 | HR 88 | Temp 98.5°F | Resp 16 | Ht 63.0 in | Wt 191.0 lb

## 2014-02-17 DIAGNOSIS — H9209 Otalgia, unspecified ear: Secondary | ICD-10-CM

## 2014-02-17 DIAGNOSIS — H9202 Otalgia, left ear: Secondary | ICD-10-CM

## 2014-02-17 MED ORDER — PRAMOXINE-HC-CHLOROXYLENOL AQ 10-10-1 MG/ML OT SOLN: 3.0000 [drp] | Freq: Three times a day (TID) | OTIC | Status: DC | PRN

## 2014-02-17 MED ORDER — CEFDINIR 300 MG PO CAPS: 300.0000 mg | ORAL_CAPSULE | Freq: Two times a day (BID) | ORAL | Status: AC

## 2014-02-17 MED ORDER — OFLOXACIN 0.3 % OT SOLN: 5.0000 [drp] | Freq: Two times a day (BID) | OTIC | Status: AC

## 2014-02-17 NOTE — Progress Notes (Signed)
Subjective:    Patient ID: Holly Key, female    DOB: 1950-11-20, 63 y.o.   MRN: [id number]  Holly Key is here today for left ear pain:    Otalgia  There is pain in the left ear. This is a new problem. The current episode started in the past 7 days. The problem occurs constantly. The problem has been rapidly worsening. There has been no fever. The pain is at a severity of 7/10. The pain is moderate. Associated symptoms include drainage, ear discharge, headaches and neck pain. Pertinent negatives include no abdominal pain, coughing, diarrhea, hearing loss, rash, rhinorrhea or vomiting. She has tried ear drops (Debrox) for the symptoms. The treatment provided no relief. There is no history of a chronic ear infection. She has had 2 episodes this year.      Review of Systems  HENT: Positive for ear discharge, ear pain and postnasal drip. Negative for hearing loss, rhinorrhea and sneezing.   Respiratory: Negative for cough.   Gastrointestinal: Negative for vomiting, abdominal pain and diarrhea.  Musculoskeletal: Positive for neck pain.  Skin: Negative for rash.  Neurological: Positive for headaches.     Past Medical History  Diagnosis Date  . Breast cancer 2003    Right Breast   . Atrial premature complex   . Colon polyp   . Depression   . Anxiety   . GERD (gastroesophageal reflux disease)   . Pneumonia      Past Surgical History  Procedure Laterality Date  . Mastectomy Right 2003  . Breast reconstruction Right 2004  . Gastric fundoplication       History   Social History Narrative   Marital Status: Married (Holly Key)   Children:  Holly Key)    Pets:  Holly Key (Dogs)    Living Situation: Lives with spouse   Occupation: Holly Key   Education: Forensic psychologist (Holly Key)   Tobacco Use/Exposure:  She was exposed to a lot of smoke growing up because both of her parents smoked.  She has never smoked.     Alcohol Use:  Occasional   Drug Use:  None   Diet:   Regular   Exercise:  None    Hobbies: Reading, Gardening                 Family History  Problem Relation Age of Onset  . Heart disease Mother   . Cancer Mother     Mother  . Cancer Father     Kidney  . Heart disease Brother      Current Outpatient Prescriptions on File Prior to Visit  Medication Sig Dispense Refill  . ergocalciferol (VITAMIN D2) 50000 UNITS capsule Take 1 capsule (50,000 Units total) by mouth once a week.  12 capsule  3  . fish oil-omega-3 fatty acids 1000 MG capsule Take 2 g by mouth daily.      Marland Kitchen FLUoxetine (PROZAC) 20 MG capsule Take 1 capsule (20 mg total) by mouth daily.  90 capsule  1  . rosuvastatin (CRESTOR) 40 MG tablet Take 1 tablet (40 mg total) by mouth daily.  90 tablet  1   No current facility-administered medications on file prior to visit.     Allergies  Allergen Reactions  . Erythromycin Diarrhea  . Septra [Sulfamethoxazole-Tmp Ds] Rash     Immunization History  Administered Date(s) Administered  . Influenza,inj,Quad PF,36+ Mos 07/18/2013  . Pneumococcal Conjugate-13 11/07/2013  . Pneumococcal Polysaccharide-23 08/29/2011  . Tdap 02/22/2007  . Zoster  02/15/2012       Objective:   Physical Exam  Constitutional: She appears well-nourished.  HENT:  Left Ear: There is tenderness.  Ears:  Eyes: EOM are normal.  Neck: Normal range of motion.  Cardiovascular: Normal rate and regular rhythm.   Musculoskeletal: Normal range of motion.  Neurological: She is alert.  Skin: Skin is warm.  Psychiatric: She has a normal mood and affect. Her behavior is normal. Judgment and thought content normal.      Assessment & Plan:    Holly Key was seen today for otalgia.  Diagnoses and associated orders for this visit:  Ear pain, left - ofloxacin (FLOXIN) 0.3 % otic solution; Place 5 drops into the left ear 2 (two) times daily. - cefdinir (OMNICEF) 300 MG capsule; Take 1 capsule (300 mg total) by mouth 2 (two) times daily. -      Pramoxine-HC-Chloroxylenol Aq 10-10-1 MG/ML SOLN; Place 3 drops in ear(s) 3 (three) times daily as needed.

## 2014-03-18 ENCOUNTER — Encounter: Payer: Self-pay | Admitting: Family Medicine

## 2014-03-18 ENCOUNTER — Ambulatory Visit (INDEPENDENT_AMBULATORY_CARE_PROVIDER_SITE_OTHER): Payer: 59 | Admitting: Family Medicine

## 2014-03-18 VITALS — BP 107/70 | HR 89 | Resp 16 | Ht 63.0 in | Wt 192.0 lb

## 2014-03-18 DIAGNOSIS — M79609 Pain in unspecified limb: Secondary | ICD-10-CM

## 2014-03-18 DIAGNOSIS — M79601 Pain in right arm: Secondary | ICD-10-CM

## 2014-03-18 MED ORDER — TRAMADOL HCL 50 MG PO TABS: 50.0000 mg | ORAL_TABLET | Freq: Three times a day (TID) | ORAL | Status: AC | PRN

## 2014-03-18 NOTE — Progress Notes (Signed)
Subjective:    Patient ID: Holly Key, female    DOB: 16-Feb-1951, 63 y.o.   MRN: [id number]  HPI  Holly Key is here today complaining of pain and swelling that is located  under her right arm and elbow.  She has had this pain for about 4-6 weeks. She thinks that she probably pulled something in her arm which lifting something heavy.  She has been very active with her grandchildren and has been bowling. She has taken Advil, Tylenol and Flexeril which has helped. She just wants to be sure since she had a mastectomy on that side that its nothing going on there from it.    Review of Systems  Constitutional: Negative for activity change, appetite change and fatigue.  Cardiovascular: Negative for chest pain, palpitations and leg swelling.  Musculoskeletal:       Pain/Swelling under right arm and elbow  All other systems reviewed and are negative.    Past Medical History  Diagnosis Date  . Breast cancer 2003    Right Breast   . Atrial premature complex   . Colon polyp   . Depression   . Anxiety   . GERD (gastroesophageal reflux disease)   . Pneumonia      Past Surgical History  Procedure Laterality Date  . Mastectomy Right 2003  . Breast reconstruction Right 2004  . 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  She has never smoked.     Alcohol Use:  Occasional   Drug Use:  None   Diet:  Regular   Exercise:  None    Hobbies: Reading, Gardening                 Family History  Problem Relation Age of Onset  . Heart disease Mother   . Cancer Mother     Mother  . Cancer Father     Kidney  . Heart disease Brother      Current  Outpatient Prescriptions on File Prior to Visit  Medication Sig Dispense Refill  . ergocalciferol (VITAMIN D2) 50000 UNITS capsule Take 1 capsule (50,000 Units total) by mouth once a week.  12 capsule  3  . fish oil-omega-3 fatty acids 1000 MG capsule Take 2 g by mouth daily.      Marland Kitchen FLUoxetine (PROZAC) 20 MG capsule Take 1 capsule (20 mg total) by mouth daily.  90 capsule  1  . rosuvastatin (CRESTOR) 40 MG tablet Take 1 tablet (40 mg total) by mouth daily.  90 tablet  1   No current facility-administered medications on file prior to visit.     Allergies  Allergen Reactions  . Erythromycin Diarrhea  . Septra [Sulfamethoxazole-Tmp Ds] Rash     Immunization History  Administered Date(s) Administered  . Influenza,inj,Quad PF,36+ Mos 07/18/2013  . Pneumococcal Conjugate-13 11/07/2013  . Pneumococcal Polysaccharide-23 08/29/2011  . Tdap 02/22/2007  . Zoster 02/15/2012       Objective:   Physical Exam  Nursing note and vitals reviewed. Constitutional: She is oriented to person, place, and time. She appears well-developed and well-nourished.  HENT:  Head:  Normocephalic.  Eyes: Pupils are equal, round, and reactive to light.  Neck: Normal range of motion.  Cardiovascular: Normal rate.   Pulmonary/Chest: Effort normal.  Abdominal: Soft.  Musculoskeletal: She exhibits tenderness.       Arms: Neurological: She is alert and oriented to person, place, and time.  Skin: Skin is warm and dry.  Psychiatric: She has a normal mood and affect. Her behavior is normal. Judgment and thought content normal.      Assessment & Plan:    Holly Key was seen today for arm pain.  Diagnoses and associated orders for this visit:  Right arm pain - traMADol (ULTRAM) 50 MG tablet; Take 1 tablet (50 mg total) by mouth every 8 (eight) hours as needed for moderate pain.

## 2014-04-23 IMAGING — US US ABDOMEN COMPLETE
1 series · 14 of 25 positions shown · non-contrast
Comparison: CT scan performed 05/02/2008

CLINICAL DATA: Epigastric pain for 10 days

EXAM:
ULTRASOUND ABDOMEN COMPLETE

[Series 1: us abdomen complete · 0.37mm/px · 14 of 90 slices shown]
[im 1/90]
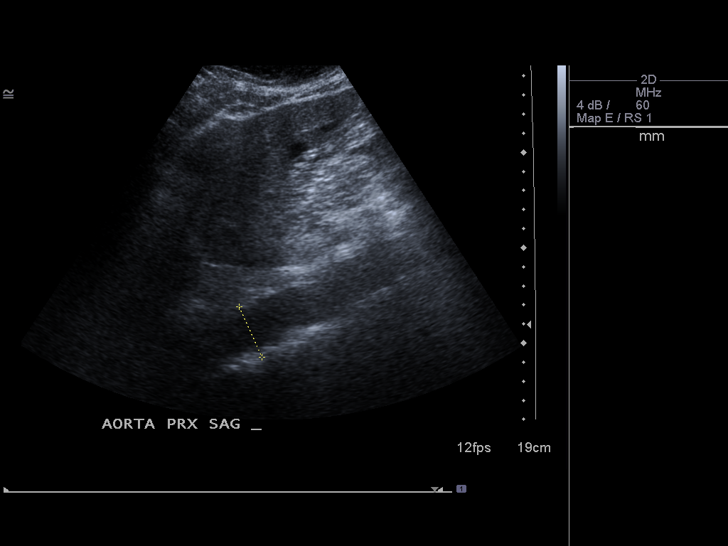
[im 8/90]
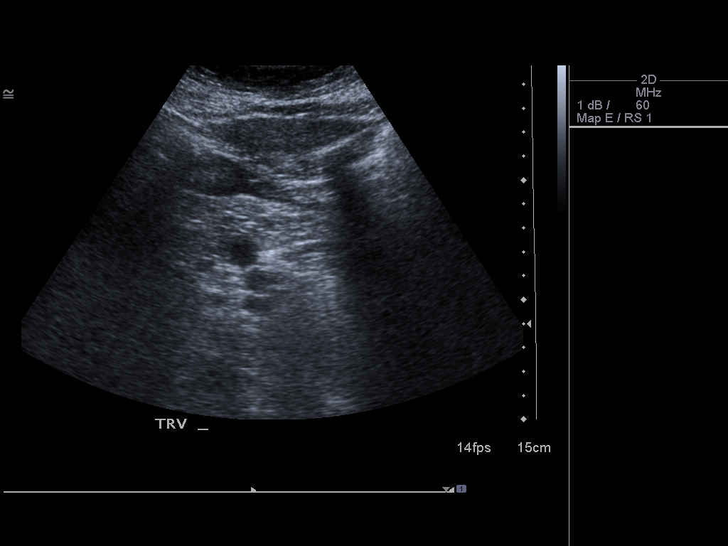
[im 15/90]
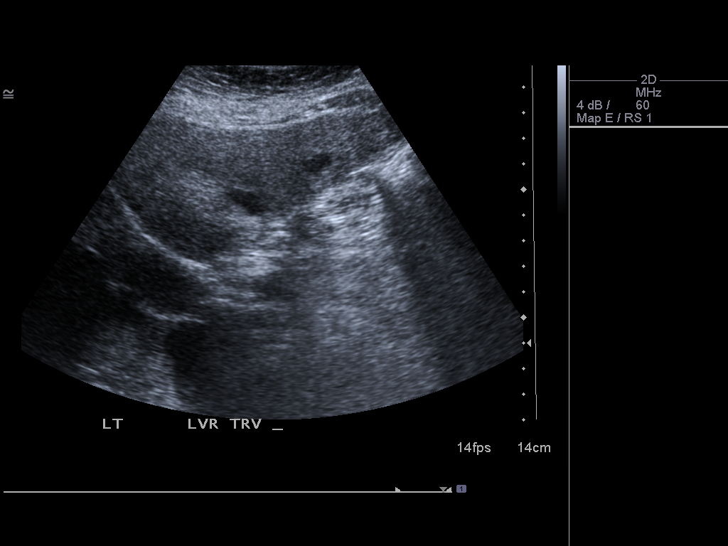
[im 23/90]
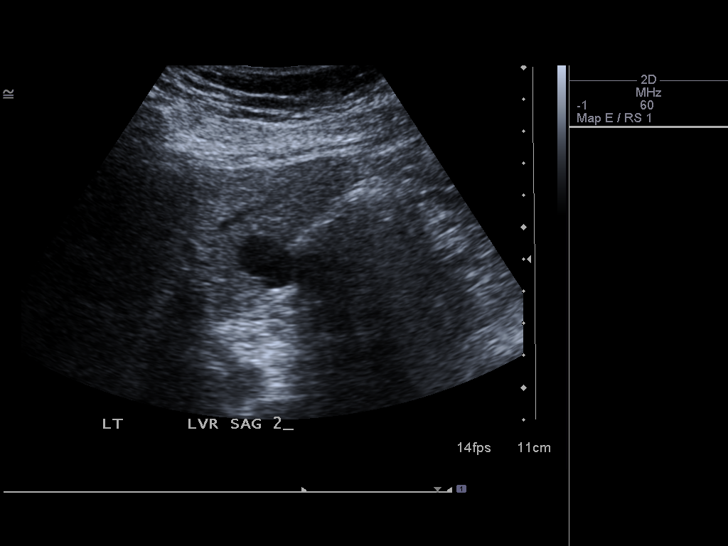
[im 30/90]
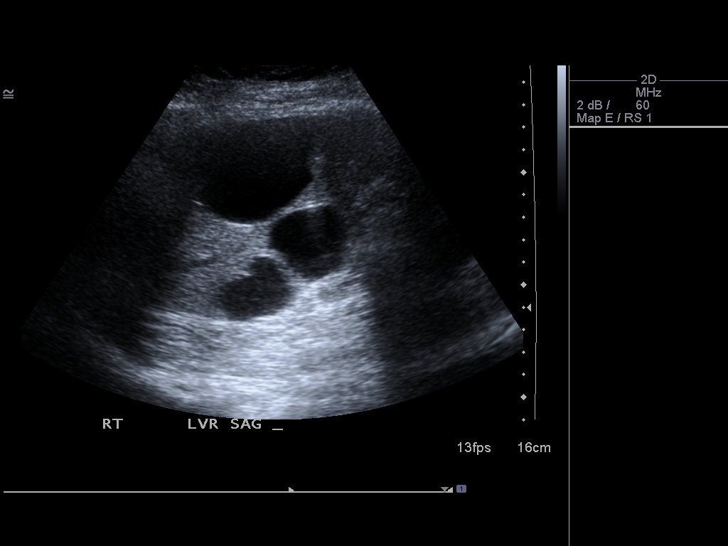
[im 34/90]
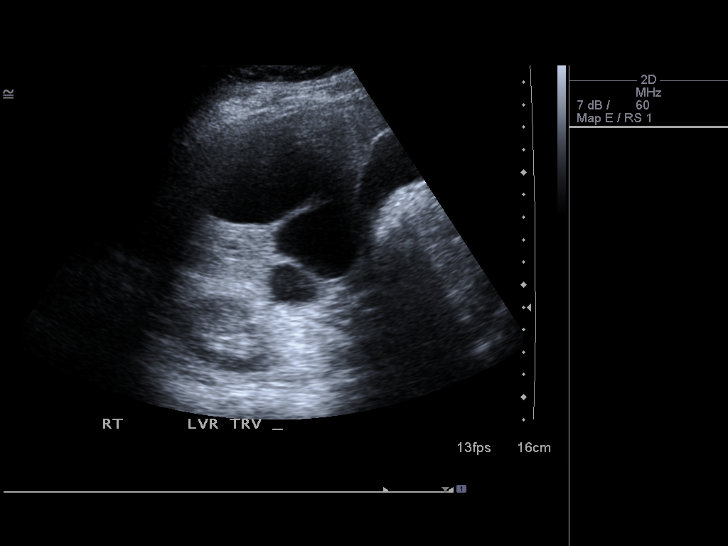
[im 41/90]
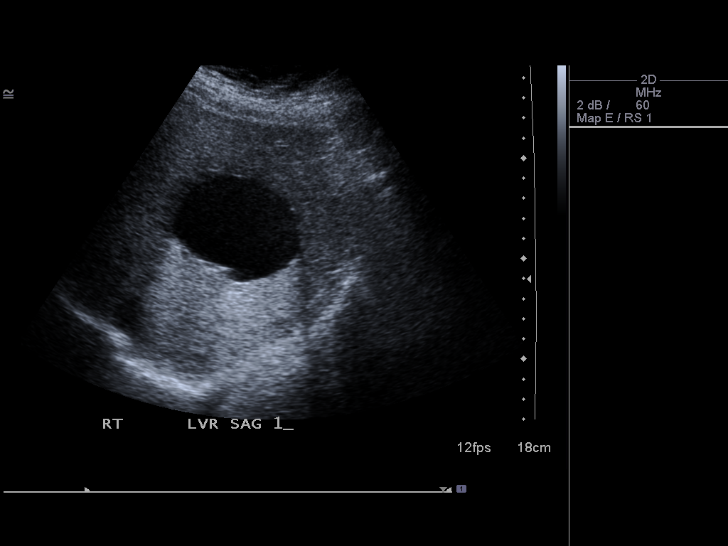
[im 49/90]
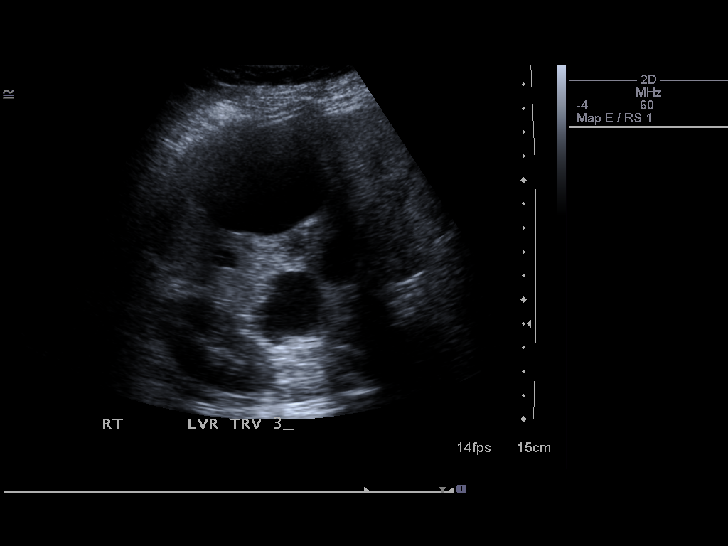
[im 56/90]
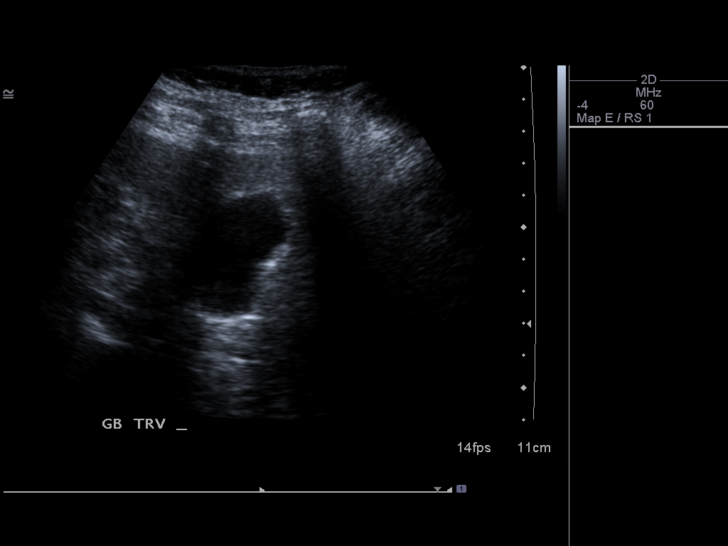
[im 60/90]
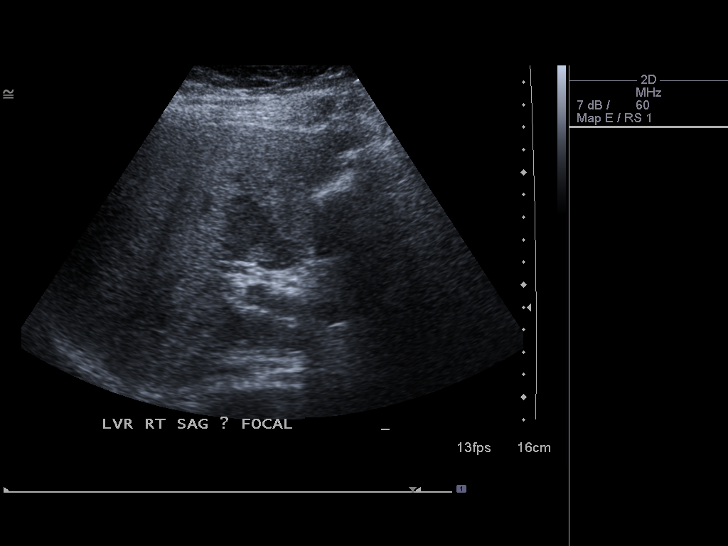
[im 67/90]
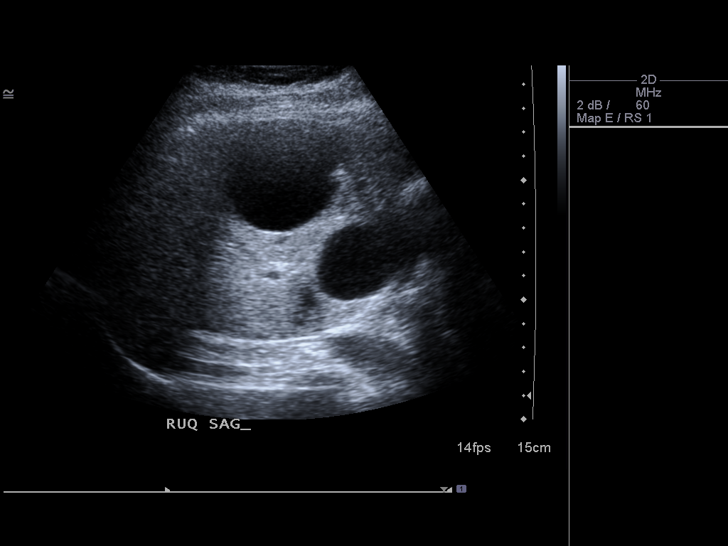
[im 75/90]
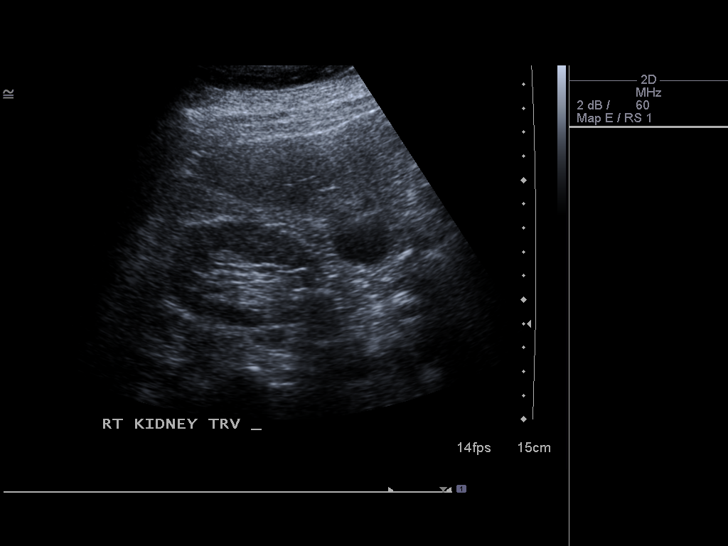
[im 82/90]
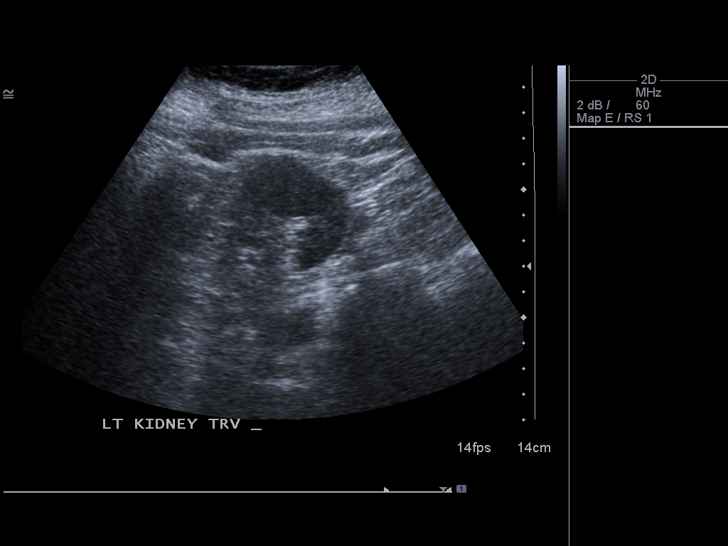
[im 90/90]
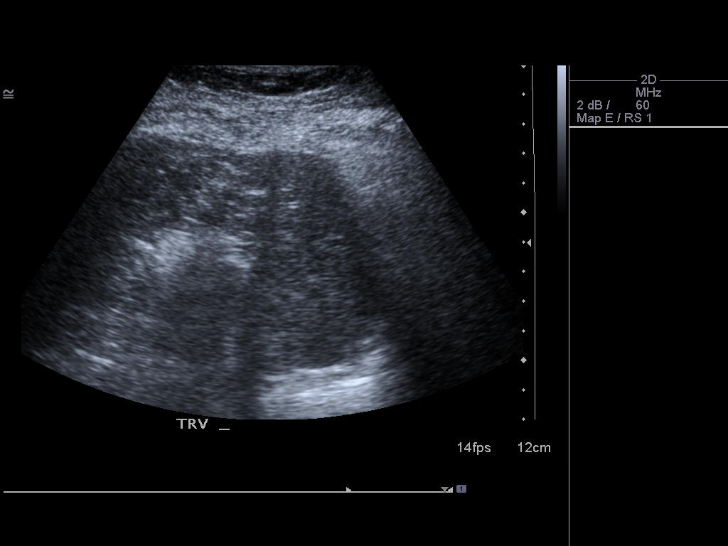

[14 of 25 positions shown; findings below may reference images not displayed]

FINDINGS: Gallbladder

No gallstones or wall thickening. Negative sonographic Murphy's
sign.

Common bile duct

Diameter: 4 mm; distal common bile duct difficult to evaluate

Liver

Multiple hepatic cysts identified in both the right and left lobes,
largest being in the right lobe measuring 6.5 cm.

IVC

Grossly normal but not seen well on

Pancreas

Grossly negative but not well characterized

Spleen

Size and appearance within normal limits.

Right Kidney

Length: 12 cm Echogenicity within normal limits. No mass or
hydronephrosis visualized.

Left Kidney

Length: 10.4 cm Echogenicity within normal limits. No mass or
hydronephrosis visualized.

Abdominal aorta

Limited evaluation due to bowel gas. No dilatation identified.
IMPRESSION: No acute abnormalities.

## 2014-05-09 ENCOUNTER — Encounter: Payer: Self-pay | Admitting: Family Medicine

## 2014-05-09 ENCOUNTER — Ambulatory Visit (INDEPENDENT_AMBULATORY_CARE_PROVIDER_SITE_OTHER): Payer: 59 | Admitting: Family Medicine

## 2014-05-09 VITALS — BP 120/78 | HR 80 | Resp 16 | Ht 63.0 in | Wt 191.0 lb

## 2014-05-09 DIAGNOSIS — F411 Generalized anxiety disorder: Secondary | ICD-10-CM

## 2014-05-09 DIAGNOSIS — F329 Major depressive disorder, single episode, unspecified: Secondary | ICD-10-CM

## 2014-05-09 DIAGNOSIS — F32A Depression, unspecified: Secondary | ICD-10-CM

## 2014-05-09 DIAGNOSIS — E785 Hyperlipidemia, unspecified: Secondary | ICD-10-CM

## 2014-05-09 DIAGNOSIS — F3289 Other specified depressive episodes: Secondary | ICD-10-CM

## 2014-05-09 MED ORDER — FLUOXETINE HCL 20 MG PO CAPS: 20.0000 mg | ORAL_CAPSULE | Freq: Every day | ORAL | Status: AC

## 2014-05-09 MED ORDER — ROSUVASTATIN CALCIUM 40 MG PO TABS: 40.0000 mg | ORAL_TABLET | Freq: Every day | ORAL | Status: AC

## 2014-05-09 NOTE — Progress Notes (Signed)
Subjective:    Patient ID: Holly Key, female    DOB: [birth year], 63 y.o.   MRN: [id number]  HPI   Holly Key is here today needing to get a refill on her Prozac and Crestor.  She is doing well on these two medications.  Her mood is good on Prozac and she does not have any adverse side effects on Crestor.     Review of Systems  Constitutional: Negative for activity change, appetite change and fatigue.  Cardiovascular: Negative for chest pain, palpitations and leg swelling.  Psychiatric/Behavioral: The patient is not nervous/anxious.   All other systems reviewed and are negative.    Past Medical History  Diagnosis Date  . Breast cancer 2003    Right Breast   . Atrial premature complex   . Colon polyp   . Depression   . Anxiety   . GERD (gastroesophageal reflux disease)   . Pneumonia      Past Surgical History  Procedure Laterality Date  . Mastectomy Right 2003  . Breast reconstruction Right 2004  . 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  She has never smoked.     Alcohol Use:  Occasional   Drug Use:  None   Diet:  Regular   Exercise:  None    Hobbies: Reading, Gardening                 Family History  Problem Relation Age of Onset  . Heart disease Mother   . Cancer Mother     Mother  . Cancer Father     Kidney  . Heart disease Brother      Current Outpatient Prescriptions on File Prior to Visit  Medication Sig Dispense Refill  . ergocalciferol (VITAMIN D2) 50000 UNITS capsule Take 1 capsule (50,000 Units total) by mouth once a week.  12 capsule  3  . fish oil-omega-3 fatty acids 1000 MG capsule Take 2 g by mouth  daily.      . traMADol (ULTRAM) 50 MG tablet Take 1 tablet (50 mg total) by mouth every 8 (eight) hours as needed for moderate pain.  90 tablet  5   No current facility-administered medications on file prior to visit.     Allergies  Allergen Reactions  . Erythromycin Diarrhea  . Septra [Sulfamethoxazole-Tmp Ds] Rash     Immunization History  Administered Date(s) Administered  . Influenza,inj,Quad PF,36+ Mos 07/18/2013  . Pneumococcal Conjugate-13 11/07/2013  . Pneumococcal Polysaccharide-23 08/29/2011  . Tdap 02/22/2007  . Zoster 02/15/2012       Objective:   Physical Exam  Vitals reviewed. Constitutional: She appears well-nourished.  HENT:  Head: Normocephalic.  Musculoskeletal: Normal range of motion.  Neurological: She is alert.  Skin: Skin is warm and dry.  Psychiatric: She has a normal mood and affect. Her behavior is normal. Judgment and thought content normal.      Assessment & Plan:    Holly Key was seen today for medication management.  Diagnoses and associated orders for this visit:  Anxiety state, unspecified  Hyperlipidemia - rosuvastatin (CRESTOR) 40 MG tablet; Take 1  tablet (40 mg total) by mouth daily.  Depression - FLUoxetine (PROZAC) 20 MG capsule; Take 1 capsule (20 mg total) by mouth daily.

## 2014-05-17 ENCOUNTER — Other Ambulatory Visit: Payer: Self-pay | Admitting: Family Medicine

## 2014-05-27 ENCOUNTER — Other Ambulatory Visit: Payer: Self-pay | Admitting: Family Medicine

## 2014-06-08 ENCOUNTER — Encounter: Payer: Self-pay | Admitting: Family Medicine

## 2015-07-29 ENCOUNTER — Ambulatory Visit: Payer: 59 | Attending: Hematology and Oncology | Admitting: Physical Therapy

## 2015-07-29 DIAGNOSIS — M79601 Pain in right arm: Secondary | ICD-10-CM | POA: Diagnosis present

## 2015-07-29 DIAGNOSIS — I890 Lymphedema, not elsewhere classified: Secondary | ICD-10-CM | POA: Insufficient documentation

## 2015-07-29 DIAGNOSIS — M79621 Pain in right upper arm: Secondary | ICD-10-CM

## 2015-07-29 NOTE — Therapy (Signed)
[place], [place], [address] Phone: [phone]   Fax:  [phone]  Physical Therapy Evaluation  Patient Details  Name: Holly Key MRN: [id number] Date of Birth: May 20, 1951 Referring Provider: Dr. Dustin Folks  Encounter Date: 07/29/2015      PT End of Session - 07/29/15 1424    Visit Number 1   Number of Visits 19   Date for PT Re-Evaluation 09/11/15   PT Start Time 1310   PT Stop Time 1415   PT Time Calculation (min) 65 min   Activity Tolerance Patient tolerated treatment well   Behavior During Therapy [facility] for tasks assessed/performed      Past Medical History  Diagnosis Date  . Breast cancer 2003    Right Breast   . Atrial premature complex   . Colon polyp   . Depression   . Anxiety   . GERD (gastroesophageal reflux disease)   . Pneumonia     Past Surgical History  Procedure Laterality Date  . Mastectomy Right 2003  . Breast reconstruction Right 2004  . Gastric fundoplication      There were no vitals filed for this visit.  Visit Diagnosis:  Lymphedema of arm - Plan: PT plan of care cert/re-cert  Diffuse pain in right upper extremity - Plan: PT plan of care cert/re-cert  Pain in right axilla - Plan: PT plan of care cert/re-cert      Subjective Assessment - 07/29/15 1318    Subjective Has swelling and pain in right arm and axilla.   Pertinent History 2003 right mastectomy 5 sentinel nodes removed; then 6 rounds of chemo, no radiation.  Had been doing fine but a year and a half ago, went bowling.  After that had pain in right elbow and shoulder, then axilla, and then swelling that started proximal and moved down into the hand.  Both pain and swelling have worsened.  HHad breast reconstruction in 2004 with implant.  Had two basal cell skin cancers on chin and neck this year.  Blood pressure elevates to around 140 when she is in pain.  Two years ago had pneumonia and was  hospitalized for 5 days.     Patient Stated Goals less pain and swelling in right arm   Currently in Pain? Yes   Pain Score 6   8   Pain Location Axilla   Pain Orientation Right   Pain Descriptors / Indicators Sharp;Other (Comment)  pulling   Pain Type Chronic pain   Aggravating Factors  lying on left side, moving around   Pain Relieving Factors tylenol or advil   Effect of Pain on Daily Activities keeps her awake at night            [facility] PT Assessment - 07/29/15 0001    Assessment   Medical Diagnosis right breast cancer with mastectomy   Referring Provider Dr. Dustin Folks   Onset Date/Surgical Date 12/08/01  approx.   Precautions   Precautions Other (comment)  reaction to lidocaine   Balance Screen   Has the patient fallen in the past 6 months No   Has the patient had a decrease in activity level because of a fear of falling?  No   Is the patient reluctant to leave their home because of a fear of falling?  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; no heavy lifting on the job; job is stressful; plans to retire next year   Leisure no regular exercise   Cognition   Overall Cognitive Status Within Functional Limits for tasks assessed   Observation/Other Assessments   Other Surveys  --  lymphedema life impact scale = 27 = 40% impairment   Posture/Postural Control   Posture/Postural Control Postural limitations   Postural Limitations Rounded Shoulders;Forward head  slight   ROM / Strength   AROM / PROM / Strength AROM;Strength   AROM   Overall AROM Comments both shoulders WFL but right hurts and feels pulling   Strength   Overall Strength Comments attempted MMT was painful    Palpation   Palpation comment localized tenderness in central right axilla           LYMPHEDEMA/ONCOLOGY QUESTIONNAIRE - 07/29/15 1330    Type   Cancer Type right breast    Surgeries   Mastectomy Date 12/08/01  approx.   Saline Implant Reconstruction Date 03/11/03  approx.   Date Lymphedema/Swelling Started   Date 01/08/14  approx.   Treatment   Past Chemotherapy Treatment Yes   Date 08/31/02  approx.   Past Radiation Treatment No   What other symptoms do you have   Are you Having Heaviness or Tightness Yes   Are you having Pain Yes   Are you having pitting edema Yes   Body Site right arm   Stemmer Sign No   Lymphedema Assessments   Lymphedema Assessments Upper extremities   Right Upper Extremity Lymphedema   15 cm Proximal to Olecranon Process 37.4 cm   10 cm Proximal to Olecranon Process 38.1 cm   Olecranon Process 29.3 cm   15 cm Proximal to Ulnar Styloid Process 30.1 cm   10 cm Proximal to Ulnar Styloid Process 28.2 cm   Just Proximal to Ulnar Styloid Process 19 cm   Across Hand at [employer] 19.2 cm   At [place] of 2nd Digit 5.8 cm   Left Upper Extremity Lymphedema   15 cm Proximal to Olecranon Process 36.4 cm   10 cm Proximal to Olecranon Process 34.9 cm   Olecranon Process 26.2 cm   15 cm Proximal to Ulnar Styloid Process 26.1 cm   10 cm Proximal to Ulnar Styloid Process 24.2 cm   Just Proximal to Ulnar Styloid Process 16.8 cm   Across Hand at [employer] 18.2 cm   At [place] of 2nd Digit 5.7 cm                [facility] Adult PT Treatment/Exercise - 07/29/15 0001    Self-Care   Self-Care Other Self-Care Comments   Other Self-Care Comments  educated patient in some detail about infection risk, signs, prevention, and treatment; discussed and gave NLN lymphedema risk reduction guidelines, ABC class lymphedema info, and Lymphedema brochure                PT Education - 07/29/15 1423    Education provided Yes   Education Details  lymphedema risk reduction, infection risk/ID/prevention/treament, [facility] Lymphedema brochure   Person(s) Educated Patient   Methods Explanation;Handout   Comprehension Verbalized understanding;Need further instruction           Short Term Clinic Goals - 07/29/15 1432    CC Short  Term Goal  #1   Title Patient will be knowledgeable about lymphedema risk reduction.   Time 3   Period Weeks   Status New   CC Short Term Goal  #2   Title Right arm circumference at 10 cm. proximal to olecranon will reduce by 1 cm. to 37.1 cm.   Baseline 38.1 cm. at eval compared to 34.9 on the left.   Time 3   Period Weeks   Status New   CC Short Term Goal  #3   Title Right arm circumference at 10 cm. proximal to ulnar styloid will reduce by 1.5 cm. to 26.7   Baseline 28.2 cm. at eval compared to 24.2 on the left.   Time 3   Period Weeks   Status New   CC Short Term Goal  #4   Title Pt. will report pain decrease of at least 50% in right UE   Time 3   Period Weeks   Status New             Long Term Clinic Goals - 21-Aug-2015 1530    CC Long Term Goal  #1   Title Right arm circumference at 10 cm. proximal to olecranon will reduce by 1.5 cm. to 36.5   Baseline 38.1 cm. on eval compared to 34.9 on the left    Time 6   Period Weeks   Status New   CC Long Term Goal  #2   Title Right arm circumference at 10 cm. proximal to ulnar styloid will reduce by 2 cm. to 26.2   Baseline 28.2 cm. on eval compared to 24.2 on the left   Time 6   Period Weeks   Status New   CC Long Term Goal  #3   Title Patient will be knowledgeable about how and where to obtain daytime and nighttime compression garments   Time 6   Period Weeks   Status New   CC Long Term Goal  #4   Title Pt. will report pain decrease of at least 75% in right UE.   Time 6   Period Weeks   Status New            Plan - 08-21-15 1427    Clinical Impression Statement Patient with significant right arm swelling (4 cm. difference right  bigger than left arm in some places) that started a year and a half ago, apparently as a result of going bowling.  She is 13 years s/p mastectomy for right breast cancer.  Right arm, axilla, and shoulder area are painful as well.   Pt will benefit from skilled therapeutic intervention in order to improve on the following deficits Increased edema;Pain   Rehab Potential Excellent   PT Frequency 3x / week   PT Duration 6 weeks   PT Treatment/Interventions Manual lymph drainage;Compression bandaging;Electrical Stimulation;Patient/family education   PT Next Visit Plan begin complete decongestive therapy; later discuss daytime, nighttime compression garment options; discuss pump   Consulted and Agree with Plan of Care Patient          G-Codes - 08-21-2015 1533    Functional Assessment Tool Used lymphedema life impact scale = 27 = 40% impairment   Functional Limitation Self care   Self Care Current Status (E3154) At least 40 percent but less than 60 percent impaired, limited or restricted   Self Care Goal Status (M0867) At least 1 percent but less than 20 percent impaired, limited or restricted  Problem List Patient Active Problem List   Diagnosis Date Noted  . Need for prophylactic vaccination against Streptococcus pneumoniae (pneumococcus) 01/05/2014  . Need for prophylactic vaccination and inoculation against influenza 09/12/2013  . Impacted cerumen of right ear 04/20/2013  . Eustachian tube dysfunction 04/20/2013  . Otitis, externa, infective 04/20/2013  . TMJ (temporomandibular joint disorder) 04/15/2013  . Unspecified vitamin D deficiency 04/15/2013  . Hyperlipidemia 04/15/2013  . Hip pain 03/03/2013  . Depression 03/03/2013  . Bursitis of hip 03/03/2013    SALISBURY,[name] 07/29/2015, 3:46 PM  [place], [place], [address] Phone: [phone]   Fax:  [phone]  Name: Holly Key MRN:  [id number] Date of Birth: Jun 16, 1951   [name], PT 07/29/2015 3:46 PM

## 2015-08-03 ENCOUNTER — Ambulatory Visit: Payer: 59 | Admitting: Physical Therapy

## 2015-08-03 DIAGNOSIS — M79601 Pain in right arm: Secondary | ICD-10-CM

## 2015-08-03 DIAGNOSIS — I890 Lymphedema, not elsewhere classified: Secondary | ICD-10-CM

## 2015-08-03 DIAGNOSIS — M79621 Pain in right upper arm: Secondary | ICD-10-CM

## 2015-08-03 NOTE — Therapy (Signed)
[place], [place], [address] Phone: [phone]   Fax:  [phone]  Physical Therapy Treatment  Patient Details  Name: Holly Key MRN: [id number] Date of Birth: 03/31/1951 Referring Provider: Dr. Dustin Folks  Encounter Date: 08/03/2015      PT End of Session - 08/03/15 1228    Visit Number 2   Number of Visits 19   Date for PT Re-Evaluation 09/11/15   PT Start Time 0937   PT Stop Time 1023   PT Time Calculation (min) 46 min   Activity Tolerance Patient tolerated treatment well   Behavior During Therapy [facility] for tasks assessed/performed      Past Medical History  Diagnosis Date  . Breast cancer 2003    Right Breast   . Atrial premature complex   . Colon polyp   . Depression   . Anxiety   . GERD (gastroesophageal reflux disease)   . Pneumonia     Past Surgical History  Procedure Laterality Date  . Mastectomy Right 2003  . Breast reconstruction Right 2004  . Gastric fundoplication      There were no vitals filed for this visit.  Visit Diagnosis:  Lymphedema of arm  Diffuse pain in right upper extremity  Pain in right axilla      Subjective Assessment - 08/03/15 0937    Subjective "I'm really excited about getting this started."  "I went and had a cortisone shot Friday; it hasn't helped yet, but it usually takes a few days for it to kick in."   Currently in Pain? Yes   Pain Score 6    Pain Location Axilla   Pain Orientation Right   Pain Frequency Intermittent   Aggravating Factors  lying on left side; otherwise, hard to tell   Pain Relieving Factors doesn't know                         [facility] Adult PT Treatment/Exercise - 08/03/15 0001    Manual Therapy   Manual Therapy Edema management;Manual Lymphatic Drainage (MLD);Compression Bandaging   Edema Management Instructed in diaphragmatic breathing; began instruction in princliples of manual lymph  drainage.   Manual Lymphatic Drainage (MLD) In supine:  short neck, superficial and deep abdomen, left axilla and anterior interaxillary anastomosis, right groin and axillo-inguinal anastomosis, and right UE from fingers to shoulder.  In left sidelying, posterior interaxillary anastomosis from right to left.   Compression Bandaging Lotion applied, then bandaging with stockinette, Elastomull to five fingers, Artiflex x 1, and 1-6 cm,. 1-10 cm., and 1-12 cm. short stretch bandage from hand to shoulder of right UE.                PT Education - 08/03/15 1227    Education provided Yes   Education Details UE lymphedema exercise as per instruction section; to do these also in case of pain or paresthesias, and if no relief from them, to take off bandages   Person(s) Educated Patient   Methods Explanation;Demonstration;Handout   Comprehension Verbalized understanding;Returned demonstration           [place] - 07/29/15 1432    CC Short Term Goal  #1   Title Patient will be knowledgeable about lymphedema risk reduction.   Time 3   Period Weeks   Status New   CC Short Term Goal  #2   Title Right arm circumference at 10 cm. proximal to olecranon  will reduce by 1 cm. to 37.1 cm.   Baseline 38.1 cm. at eval compared to 34.9 on the left.   Time 3   Period Weeks   Status New   CC Short Term Goal  #3   Title Right arm circumference at 10 cm. proximal to ulnar styloid will reduce by 1.5 cm. to 26.7   Baseline 28.2 cm. at eval compared to 24.2 on the left.   Time 3   Period Weeks   Status New   CC Short Term Goal  #4   Title Pt. will report pain decrease of at least 50% in right UE   Time 3   Period Weeks   Status New             Long Term Clinic Goals - 07/29/15 1530    CC Long Term Goal  #1   Title Right arm circumference at 10 cm. proximal to olecranon will reduce by 1.5 cm. to 36.5   Baseline 38.1 cm. on eval compared to 34.9 on the left    Time 6    Period Weeks   Status New   CC Long Term Goal  #2   Title Right arm circumference at 10 cm. proximal to ulnar styloid will reduce by 2 cm. to 26.2   Baseline 28.2 cm. on eval compared to 24.2 on the left   Time 6   Period Weeks   Status New   CC Long Term Goal  #3   Title Patient will be knowledgeable about how and where to obtain daytime and nighttime compression garments   Time 6   Period Weeks   Status New   CC Long Term Goal  #4   Title Pt. will report pain decrease of at least 75% in right UE.   Time 6   Period Weeks   Status New            Plan - 08/03/15 1229    Clinical Impression Statement Patient motivated to do complete decongestive therapy and get her arm feeling better and reduced.  She did note some pain decrease after manual lymph drainage, and stated that the bandages felt good on her arm.   Pt will benefit from skilled therapeutic intervention in order to improve on the following deficits Increased edema;Pain   Rehab Potential Excellent   PT Frequency 3x / week   PT Duration 6 weeks   PT Treatment/Interventions Manual lymph drainage;Compression bandaging;Patient/family education   PT Next Visit Plan continue complete decongestive therapy; continue discussion of equipment   PT Home Exercise Plan UE remedial lymphedema exercise   Consulted and Agree with Plan of Care Patient        Problem List Patient Active Problem List   Diagnosis Date Noted  . Need for prophylactic vaccination against Streptococcus pneumoniae (pneumococcus) 01/05/2014  . Need for prophylactic vaccination and inoculation against influenza 09/12/2013  . Impacted cerumen of right ear 04/20/2013  . Eustachian tube dysfunction 04/20/2013  . Otitis, externa, infective 04/20/2013  . TMJ (temporomandibular joint disorder) 04/15/2013  . Unspecified vitamin D deficiency 04/15/2013  . Hyperlipidemia 04/15/2013  . Hip pain 03/03/2013  . Depression 03/03/2013  . Bursitis of hip 03/03/2013     Zainah Steven 08/03/2015, 12:31 PM  [place], [place], [address] Phone: [phone]   Fax:  [phone]  Name: RAYNA BRENNER MRN: [id number] Date of Birth: 1951-06-26    [name], PT 08/03/2015 12:31 PM

## 2015-08-03 NOTE — Patient Instructions (Signed)
Neck Side-Bending   Begin with chin level, head centered over spine. Slowly lower one ear toward shoulder keeping shoulder down. Hold __3__ seconds. Slowly return to starting position. Repeat to other side.  Repeat __5-10__ times to each side. Do 2-3 times a day. Also stretch by looking over right shoulder, then left.  Scapular Retraction (Standing)   With arms at sides, pinch shoulder blades together.  Hold 3 seconds. Repeat __5-10__ times per set.  Do __2-3__ sessions per day.   Active ROM Flexion    Raise arm to point to ceiling, keeping elbow straight. Hold __3__ seconds. Repeat _5-10___ times. Do _2-3___ sessions per day. Activity: Use this motion to reach for items on overhead shelves.     Flexion (Active)   Palm up, rest elbow on other hand. Bend as far as possible. Hold __3__ seconds. Repeat __5-10__ times. Do _2-3___ sessions per day. Can use other hand to bend elbow further if needed where bandage may limit end motion.  Copyright  VHI. All rights reserved.    Extension (Active With Finger Extension)   Llift hand with fingers straight, then bend wrist down.  Hold _3___ seconds each way. Repeat __5-10__ times. Do _2-3___ sessions per day.    AROM: Forearm Pronation / Supination   With right arm in handshake position, slowly rotate palm down. Relax. Then rotate palm up. Repeat __5-10__ times per set. Do __2-3__ sessions per day.  Copyright  VHI. All rights reserved.    AROM: Finger Flexion / Extension   Actively bend fingers of right hand. Start with knuckles furthest from palm, and slowly make a fist. Hold __3__ seconds. Relax. Then straighten fingers as far as possible. Repeat _5-10___ times per set.  Do __2-3__ sessions per day. Also spread fingers as far as able, then bring them back together.  Copyright  VHI. All rights reserved.   If any pain/tingling symptoms occur, try all of these exercises first to promote circulation. If symptoms do not  ease off, then remove bandages and bring all wrappings back to next appointment.    

## 2015-08-05 ENCOUNTER — Ambulatory Visit: Payer: 59

## 2015-08-05 DIAGNOSIS — M79601 Pain in right arm: Secondary | ICD-10-CM

## 2015-08-05 DIAGNOSIS — M79621 Pain in right upper arm: Secondary | ICD-10-CM

## 2015-08-05 DIAGNOSIS — I890 Lymphedema, not elsewhere classified: Secondary | ICD-10-CM

## 2015-08-05 NOTE — Therapy (Signed)
[place], [place], [address] Phone: [phone]   Fax:  [phone]  Physical Therapy Treatment  Patient Details  Name: Holly Key MRN: [id number] Date of Birth: [birth year] Referring Provider: Dr. Dustin Folks  Encounter Date: 08/05/2015      PT End of Session - 08/05/15 1522    Visit Number 3   Number of Visits 19   Date for PT Re-Evaluation 09/11/15   PT Start Time 2774   PT Stop Time 1521   PT Time Calculation (min) 43 min   Activity Tolerance Patient tolerated treatment well   Behavior During Therapy [facility] for tasks assessed/performed      Past Medical History  Diagnosis Date  . Breast cancer 2003    Right Breast   . Atrial premature complex   . Colon polyp   . Depression   . Anxiety   . GERD (gastroesophageal reflux disease)   . Pneumonia     Past Surgical History  Procedure Laterality Date  . Mastectomy Right 2003  . Breast reconstruction Right 2004  . Gastric fundoplication      There were no vitals filed for this visit.  Visit Diagnosis:  Lymphedema of arm  Diffuse pain in right upper extremity  Pain in right axilla      Subjective Assessment - 08/05/15 1446    Subjective Kept the bandages on, don't have any complaints. Not having any pain right now, so that's nice.    Currently in Pain? No/denies               LYMPHEDEMA/ONCOLOGY QUESTIONNAIRE - 08/05/15 1448    Right Upper Extremity Lymphedema   15 cm Proximal to Olecranon Process 37.2 cm   10 cm Proximal to Olecranon Process 37.3 cm   Olecranon Process 29 cm   15 cm Proximal to Ulnar Styloid Process 29.1 cm   10 cm Proximal to Ulnar Styloid Process 26.6 cm   Just Proximal to Ulnar Styloid Process 17.7 cm   Across Hand at [employer] 18.4 cm   At [place] of 2nd Digit 5.7 cm                  [facility] Adult PT Treatment/Exercise - 08/05/15 0001    Manual Therapy   Manual Lymphatic  Drainage (MLD) In supine:  short neck, superficial and deep abdomen, left axilla and anterior interaxillary anastomosis, right groin and axillo-inguinal anastomosis, and right UE from fingers to shoulder. then Lt S/L for posterior inter-axillary anastomosis.   Compression Bandaging Lotion applied, then bandaging with stockinette, Elastomull to five fingers, Artiflex x 1 and 1/2" gray foam at anterior elbow, and 1-6 cm,. 1-10 cm., and 1-12 cm. short stretch bandage from hand to shoulder of right UE.                   Short Term Clinic Goals - 08/05/15 1528    CC Short Term Goal  #1   Title Patient will be knowledgeable about lymphedema risk reduction.   Status On-going   CC Short Term Goal  #2   Title Right arm circumference at 10 cm. proximal to olecranon will reduce by 1 cm. to 37.1 cm.  37.3 cm reduction attained 08/05/15   Status Achieved   CC Short Term Goal  #3   Title Right arm circumference at 10 cm. proximal to ulnar styloid will reduce by 1.5 cm. to 26.7  26.6 cm reduction attained 08/05/15  CC Short Term Goal  #4   Title Pt. will report pain decrease of at least 50% in right UE   Status On-going             [place] - 07/29/15 1530    CC Long Term Goal  #1   Title Right arm circumference at 10 cm. proximal to olecranon will reduce by 1.5 cm. to 36.5   Baseline 38.1 cm. on eval compared to 34.9 on the left    Time 6   Period Weeks   Status New   CC Long Term Goal  #2   Title Right arm circumference at 10 cm. proximal to ulnar styloid will reduce by 2 cm. to 26.2   Baseline 28.2 cm. on eval compared to 24.2 on the left   Time 6   Period Weeks   Status New   CC Long Term Goal  #3   Title Patient will be knowledgeable about how and where to obtain daytime and nighttime compression garments   Time 6   Period Weeks   Status New   CC Long Term Goal  #4   Title Pt. will report pain decrease of at least 75% in right UE.   Time 6   Period  Weeks   Status New            Plan - 08/05/15 1522    Clinical Impression Statement Holly Key tolerated her first time being bandages very well though upon doffing did hve some mild redness at anterior elbow so added gray foam here to prevent further irrititation. Her circumference measurements were also greatly reduced at hand and lower arm.    Pt will benefit from skilled therapeutic intervention in order to improve on the following deficits Increased edema;Pain   Rehab Potential Excellent   PT Frequency 3x / week   PT Duration 6 weeks   PT Treatment/Interventions Manual lymph drainage;Compression bandaging;Patient/family education   PT Next Visit Plan continue complete decongestive therapy; continue discussion of equipment   PT Home Exercise Plan UE remedial lymphedema exercise   Consulted and Agree with Plan of Care Patient        Problem List Patient Active Problem List   Diagnosis Date Noted  . Need for prophylactic vaccination against Streptococcus pneumoniae (pneumococcus) 01/05/2014  . Need for prophylactic vaccination and inoculation against influenza 09/12/2013  . Impacted cerumen of right ear 04/20/2013  . Eustachian tube dysfunction 04/20/2013  . Otitis, externa, infective 04/20/2013  . TMJ (temporomandibular joint disorder) 04/15/2013  . Unspecified vitamin D deficiency 04/15/2013  . Hyperlipidemia 04/15/2013  . Hip pain 03/03/2013  . Depression 03/03/2013  . Bursitis of hip 03/03/2013    Otelia Limes, PTA 08/05/2015, 3:31 PM  [place], [place], [address] Phone: [phone]   Fax:  [phone]  Name: Holly Key MRN: [id number] Date of Birth: Jan 24, 1951

## 2015-08-07 ENCOUNTER — Ambulatory Visit: Payer: 59 | Admitting: Physical Therapy

## 2015-08-07 DIAGNOSIS — I890 Lymphedema, not elsewhere classified: Secondary | ICD-10-CM

## 2015-08-07 DIAGNOSIS — M79601 Pain in right arm: Secondary | ICD-10-CM

## 2015-08-07 DIAGNOSIS — M79621 Pain in right upper arm: Secondary | ICD-10-CM

## 2015-08-07 NOTE — Patient Instructions (Signed)
[email or web address] Lymphatic flow series with Shoosh Lettick Crotzer 

## 2015-08-07 NOTE — Therapy (Signed)
[place], [place], [address] Phone: [phone]   Fax:  [phone]  Physical Therapy Treatment  Patient Details  Name: Holly Key MRN: [id number] Date of Birth: 01-01-1951 Referring Provider: Dr. Dustin Folks  Encounter Date: 08/07/2015      PT End of Session - 08/07/15 1238    Visit Number 4   Number of Visits 19   Date for PT Re-Evaluation 09/11/15   PT Start Time 0935   PT Stop Time 1030   PT Time Calculation (min) 55 min   Activity Tolerance Patient tolerated treatment well   Behavior During Therapy [facility] for tasks assessed/performed      Past Medical History  Diagnosis Date  . Breast cancer 2003    Right Breast   . Atrial premature complex   . Colon polyp   . Depression   . Anxiety   . GERD (gastroesophageal reflux disease)   . Pneumonia     Past Surgical History  Procedure Laterality Date  . Mastectomy Right 2003  . Breast reconstruction Right 2004  . Gastric fundoplication      There were no vitals filed for this visit.  Visit Diagnosis:  Lymphedema of arm  Diffuse pain in right upper extremity  Pain in right axilla      Subjective Assessment - 08/07/15 0942    Subjective pt comes in with bandages on.  After taking them off and washing arm, red place about 2 cm by 1cm that is tender to touch was on thenar dorsal side of wrist.,    Pertinent History 2003 right mastectomy 5 sentinel nodes removed; then 6 rounds of chemo, no radiation.  Had been doing fine but a year and a half ago, went bowling.  After that had pain in right elbow and shoulder, then axilla, and then swelling that started proximal and moved down into the hand.  Both pain and swelling have worsened.  HHad breast reconstruction in 2004 with implant.  Had two basal cell skin cancers on chin and neck this year.  Blood pressure elevates to around 140 when she is in pain.  Two years ago had pneumonia and was  hospitalized for 5 days.     Currently in Pain? Yes   Pain Score 4    Pain Location Arm   Pain Orientation Upper;Right   Pain Descriptors / Indicators Aching   Pain Type Chronic pain   Pain Relieving Factors exercise             [facility] PT Assessment - 08/07/15 0001    Observation/Other Assessments   Observations Pt had area of erythem at skin fold in axilla that she said "comes and goes" but she said it was more prominent today  about 3x2 cm                      [facility] Adult PT Treatment/Exercise - 08/07/15 0001    Neck Exercises: Seated   Other Seated Exercise lymphatic flow yoga series    Manual Therapy   Manual Lymphatic Drainage (MLD) In supine:  short neck, superficial and deep abdomen, left axilla and anterior interaxillary anastomosis, right groin and axillo-inguinal anastomosis, and right UE from fingers to shoulder.    Compression Bandaging Allevyn applied to red area at wrist and distal forearm, Lotion applied, then bandaging with meduim tg soft,  Elastomull to five fingers, Artiflex x 1 and 1/2" gray foam at anterior elbow, and wrist and  1-6 cm,. 1-10 cm., and 2-10 cm. short stretch bandage from hand to shoulder of right UE.                PT Education - 08/07/15 1238    Education provided Yes   Education Details lymphatic flow yoga series    Person(s) Educated Patient   Methods Explanation;Demonstration;Handout   Comprehension Verbalized understanding;Need further instruction           Short Term Clinic Goals - 08/05/15 1528    CC Short Term Goal  #1   Title Patient will be knowledgeable about lymphedema risk reduction.   Status On-going   CC Short Term Goal  #2   Title Right arm circumference at 10 cm. proximal to olecranon will reduce by 1 cm. to 37.1 cm.  37.3 cm reduction attained 08/05/15   Status Achieved   CC Short Term Goal  #3   Title Right arm circumference at 10 cm. proximal to ulnar styloid will reduce by 1.5 cm. to 26.7   26.6 cm reduction attained 08/05/15   CC Short Term Goal  #4   Title Pt. will report pain decrease of at least 50% in right UE   Status On-going             [place] - 07/29/15 1530    CC Long Term Goal  #1   Title Right arm circumference at 10 cm. proximal to olecranon will reduce by 1.5 cm. to 36.5   Baseline 38.1 cm. on eval compared to 34.9 on the left    Time 6   Period Weeks   Status New   CC Long Term Goal  #2   Title Right arm circumference at 10 cm. proximal to ulnar styloid will reduce by 2 cm. to 26.2   Baseline 28.2 cm. on eval compared to 24.2 on the left   Time 6   Period Weeks   Status New   CC Long Term Goal  #3   Title Patient will be knowledgeable about how and where to obtain daytime and nighttime compression garments   Time 6   Period Weeks   Status New   CC Long Term Goal  #4   Title Pt. will report pain decrease of at least 75% in right UE.   Time 6   Period Weeks   Status New            Plan - 08/07/15 1239    Clinical Impression Statement Whem bandages removed, tender red area at thumb side of dorsal wrist with some scattered erythema above it. Allevyn applied to this area and thick stockinette used to see if it would be better for her skin.  She continues to report back swelling and may  be a candidate for Belisse bra. alos noted to have area of erythema in her axilla that she will continue to monitor.      PT Next Visit Plan  check skin at wrist, arm and axilla. continue complete decongestive therapy; continue discussion of equipment- show Belisse bra.    Recommended Other Services [facility] [place] can order a Belisse bra, but pt will need a prescription from her Dr.    Geanie Cooley and Agree with Plan of Care Patient        Problem List Patient Active Problem List   Diagnosis Date Noted  . Need for prophylactic vaccination against Streptococcus pneumoniae (pneumococcus) 01/05/2014  . Need for prophylactic vaccination and  inoculation against influenza  09/12/2013  . Impacted cerumen of right ear 04/20/2013  . Eustachian tube dysfunction 04/20/2013  . Otitis, externa, infective 04/20/2013  . TMJ (temporomandibular joint disorder) 04/15/2013  . Unspecified vitamin D deficiency 04/15/2013  . Hyperlipidemia 04/15/2013  . Hip pain 03/03/2013  . Depression 03/03/2013  . Bursitis of hip 03/03/2013   Donato Heinz. Owens Shark, PT   08/07/2015, 12:48 PM  [place], [place], [address] Phone: [phone]   Fax:  [phone]  Name: TIPHANIE VO MRN: [id number] Date of Birth: 03-Jan-1951

## 2015-08-10 ENCOUNTER — Encounter: Payer: Self-pay | Admitting: Physical Therapy

## 2015-08-10 ENCOUNTER — Ambulatory Visit: Payer: 59 | Admitting: Physical Therapy

## 2015-08-10 DIAGNOSIS — M79601 Pain in right arm: Secondary | ICD-10-CM

## 2015-08-10 DIAGNOSIS — I890 Lymphedema, not elsewhere classified: Secondary | ICD-10-CM

## 2015-08-10 DIAGNOSIS — M79621 Pain in right upper arm: Secondary | ICD-10-CM

## 2015-08-10 NOTE — Therapy (Signed)
[place], [place], [address] Phone: [phone]   Fax:  [phone]  Physical Therapy Treatment  Patient Details  Name: Holly Key MRN: [id number] Date of Birth: [birth year] Referring Provider: Dr. Dustin Folks  Encounter Date: 08/10/2015      PT End of Session - 08/10/15 1150    Visit Number 5   Number of Visits 19   Date for PT Re-Evaluation 09/11/15   PT Start Time 1053   PT Stop Time 1146   PT Time Calculation (min) 53 min   Activity Tolerance Patient tolerated treatment well   Behavior During Therapy [facility] for tasks assessed/performed      Past Medical History  Diagnosis Date  . Breast cancer [facility]) 2003    Right Breast   . Atrial premature complex   . Colon polyp   . Depression   . Anxiety   . GERD (gastroesophageal reflux disease)   . Pneumonia     Past Surgical History  Procedure Laterality Date  . Mastectomy Right 2003  . Breast reconstruction Right 2004  . Gastric fundoplication      There were no vitals filed for this visit.  Visit Diagnosis:  Lymphedema of arm  Diffuse pain in right upper extremity  Pain in right axilla      Subjective Assessment - 08/10/15 1100    Subjective It felt like the bandaging was too tight at the top and it was pretty itchy on the creases in my arm.   Pertinent History 2003 right mastectomy 5 sentinel nodes removed; then 6 rounds of chemo, no radiation.  Had been doing fine but a year and a half ago, went bowling.  After that had pain in right elbow and shoulder, then axilla, and then swelling that started proximal and moved down into the hand.  Both pain and swelling have worsened.  HHad breast reconstruction in 2004 with implant.  Had two basal cell skin cancers on chin and neck this year.  Blood pressure elevates to around 140 when she is in pain.  Two years ago had pneumonia and was hospitalized for 5 days.     Patient Stated Goals  less pain and swelling in right arm   Currently in Pain? Yes   Pain Score 2    Pain Location Arm   Pain Orientation Right;Upper   Pain Descriptors / Indicators Aching   Pain Type Chronic pain   Pain Onset More than a month ago   Pain Frequency Intermittent               LYMPHEDEMA/ONCOLOGY QUESTIONNAIRE - 08/10/15 1101    Right Upper Extremity Lymphedema   15 cm Proximal to Olecranon Process 35.8 cm   10 cm Proximal to Olecranon Process 36.2 cm   Olecranon Process 29.3 cm   15 cm Proximal to Ulnar Styloid Process 28.5 cm   10 cm Proximal to Ulnar Styloid Process 26.5 cm   Just Proximal to Ulnar Styloid Process 17.6 cm   Across Hand at [employer] 18.2 cm   At [place] of 2nd Digit 5.9 cm                  [facility] Adult PT Treatment/Exercise - 08/10/15 0001    Manual Therapy   Manual Lymphatic Drainage (MLD) In supine:  short neck, left axilla and anterior interaxillary anastomosis, right groin and axillo-inguinal anastomosis, and right UE from fingers to shoulder.  Then in left S/L, posterior  inter-axillary and right axilo-inguinal pathway to work on reducing swelling in latissimus region   Compression Bandaging Lotion applied, then bandaging with medium tg soft,  Elastomull to five fingers, Artiflex x 1 and 1/2" gray foam at anterior elbow; 1-6 cm,. 1-10 cm., and 1-12 cm. short stretch bandage from hand to shoulder of right UE.                   Short Term Clinic Goals - 08/05/15 1528    CC Short Term Goal  #1   Title Patient will be knowledgeable about lymphedema risk reduction.   Status On-going   CC Short Term Goal  #2   Title Right arm circumference at 10 cm. proximal to olecranon will reduce by 1 cm. to 37.1 cm.  37.3 cm reduction attained 08/05/15   Status Achieved   CC Short Term Goal  #3   Title Right arm circumference at 10 cm. proximal to ulnar styloid will reduce by 1.5 cm. to 26.7  26.6 cm reduction attained 08/05/15   CC Short Term Goal   #4   Title Pt. will report pain decrease of at least 50% in right UE   Status On-going             [place] - 07/29/15 1530    CC Long Term Goal  #1   Title Right arm circumference at 10 cm. proximal to olecranon will reduce by 1.5 cm. to 36.5   Baseline 38.1 cm. on eval compared to 34.9 on the left    Time 6   Period Weeks   Status New   CC Long Term Goal  #2   Title Right arm circumference at 10 cm. proximal to ulnar styloid will reduce by 2 cm. to 26.2   Baseline 28.2 cm. on eval compared to 24.2 on the left   Time 6   Period Weeks   Status New   CC Long Term Goal  #3   Title Patient will be knowledgeable about how and where to obtain daytime and nighttime compression garments   Time 6   Period Weeks   Status New   CC Long Term Goal  #4   Title Pt. will report pain decrease of at least 75% in right UE.   Time 6   Period Weeks   Status New            Plan - 08/10/15 1150    Clinical Impression Statement Redness on right wrist has resolved and no skin irritation is present.  One less bandage used today due to complaints of tightness at proximal arm.  Measurements taken and she is making good progress espcially at her upper arm which was her primary concern.  Discussed various compression options including a daytime sleeve and glove, a night time garment, a compression bra (possibly ABC bra versus Belisse due to cost) and possibility of Flexitouch although I anticipate she can manage her swelling with daytime and night time garments and doing self massage but explained that the Flexitouch or other pump is an option.   Pt will benefit from skilled therapeutic intervention in order to improve on the following deficits Increased edema;Pain   Rehab Potential Excellent   PT Frequency 3x / week   PT Duration 6 weeks   PT Treatment/Interventions Manual lymph drainage;Compression bandaging;Patient/family education   PT Next Visit Plan Continue complete  decongestive therapy; continue equipment discussion (see clinical impression to stay consistent with patient)  Consulted and Agree with Plan of Care Patient        Problem List Patient Active Problem List   Diagnosis Date Noted  . Need for prophylactic vaccination against Streptococcus pneumoniae (pneumococcus) 01/05/2014  . Need for prophylactic vaccination and inoculation against influenza 09/12/2013  . Impacted cerumen of right ear 04/20/2013  . Eustachian tube dysfunction 04/20/2013  . Otitis, externa, infective 04/20/2013  . TMJ (temporomandibular joint disorder) 04/15/2013  . Unspecified vitamin D deficiency 04/15/2013  . Hyperlipidemia 04/15/2013  . Hip pain 03/03/2013  . Depression 03/03/2013  . Bursitis of hip 03/03/2013   Annia Friendly, PT 08/10/2015 11:55 AM  [place], [place], [address] Phone: [phone]   Fax:  [phone]  Name: Holly Key MRN: [id number] Date of Birth: 03-11-1951

## 2015-08-12 ENCOUNTER — Ambulatory Visit: Payer: 59 | Attending: Hematology and Oncology

## 2015-08-12 DIAGNOSIS — M79621 Pain in right upper arm: Secondary | ICD-10-CM

## 2015-08-12 DIAGNOSIS — M79601 Pain in right arm: Secondary | ICD-10-CM | POA: Insufficient documentation

## 2015-08-12 DIAGNOSIS — I890 Lymphedema, not elsewhere classified: Secondary | ICD-10-CM

## 2015-08-12 NOTE — Therapy (Signed)
[place], [place], [address] Phone: [phone]   Fax:  [phone]  Physical Therapy Treatment  Patient Details  Name: Holly Key MRN: [id number] Date of Birth: May 22, 1951 Referring Provider: Dr. Dustin Folks  Encounter Date: 08/12/2015      PT End of Session - 08/12/15 1715    Visit Number 6   Number of Visits 19   Date for PT Re-Evaluation 09/11/15   PT Start Time 1525   PT Stop Time 1606   PT Time Calculation (min) 41 min   Activity Tolerance Patient tolerated treatment well   Behavior During Therapy [facility] for tasks assessed/performed      Past Medical History  Diagnosis Date  . Breast cancer [facility]) 2003    Right Breast   . Atrial premature complex   . Colon polyp   . Depression   . Anxiety   . GERD (gastroesophageal reflux disease)   . Pneumonia     Past Surgical History  Procedure Laterality Date  . Mastectomy Right 2003  . Breast reconstruction Right 2004  . Gastric fundoplication      There were no vitals filed for this visit.  Visit Diagnosis:  Lymphedema of arm  Diffuse pain in right upper extremity  Pain in right axilla      Subjective Assessment - 08/12/15 1533    Subjective Got a hair in my Lt eye from my [facility] hair appt and it scratched my cornea 30x!! So taking an ointment for that. My arm is doing okay.    Currently in Pain? Yes   Pain Score 2    Pain Location Arm   Pain Orientation Right   Pain Descriptors / Indicators Aching   Pain Onset More than a month ago   Aggravating Factors  bandage had slid down   Pain Relieving Factors taking it off!               LYMPHEDEMA/ONCOLOGY QUESTIONNAIRE - 08/12/15 1535    Right Upper Extremity Lymphedema   15 cm Proximal to Olecranon Process 36.5 cm   10 cm Proximal to Olecranon Process 36.5 cm   Olecranon Process 28.7 cm   15 cm Proximal to Ulnar Styloid Process 28.2 cm   10 cm Proximal to Ulnar  Styloid Process 26.1 cm   Just Proximal to Ulnar Styloid Process 17.3 cm   Across Hand at [employer] 18 cm   At [place] of 2nd Digit 5.7 cm                  [facility] Adult PT Treatment/Exercise - 08/12/15 0001    Manual Therapy   Manual Lymphatic Drainage (MLD) In supine:  short neck, left axilla and anterior interaxillary anastomosis, right groin and axillo-inguinal anastomosis, and right UE from fingers to shoulder.  Then in left S/L, posterior inter-axillary and right axilo-inguinal pathway to work on reducing swelling in latissimus region   Compression Bandaging Lotion applied, then bandaging with medium tg soft,  Elastomull to fingers 2-5, Artiflex x 1 and 1/2" gray foam at anterior elbow; 1-6 cm,. 1-10 cm., and 1-12 cm. short stretch bandage from hand to shoulder of right UE.                   Short Term Clinic Goals - 08/05/15 1528    CC Short Term Goal  #1   Title Patient will be knowledgeable about lymphedema risk reduction.   Status On-going  CC Short Term Goal  #2   Title Right arm circumference at 10 cm. proximal to olecranon will reduce by 1 cm. to 37.1 cm.  37.3 cm reduction attained 08/05/15   Status Achieved   CC Short Term Goal  #3   Title Right arm circumference at 10 cm. proximal to ulnar styloid will reduce by 1.5 cm. to 26.7  26.6 cm reduction attained 08/05/15   CC Short Term Goal  #4   Title Pt. will report pain decrease of at least 50% in right UE   Status On-going             [place] - 07/29/15 1530    CC Long Term Goal  #1   Title Right arm circumference at 10 cm. proximal to olecranon will reduce by 1.5 cm. to 36.5   Baseline 38.1 cm. on eval compared to 34.9 on the left    Time 6   Period Weeks   Status New   CC Long Term Goal  #2   Title Right arm circumference at 10 cm. proximal to ulnar styloid will reduce by 2 cm. to 26.2   Baseline 28.2 cm. on eval compared to 24.2 on the left   Time 6   Period Weeks    Status New   CC Long Term Goal  #3   Title Patient will be knowledgeable about how and where to obtain daytime and nighttime compression garments   Time 6   Period Weeks   Status New   CC Long Term Goal  #4   Title Pt. will report pain decrease of at least 75% in right UE.   Time 6   Period Weeks   Status New            Plan - 08/12/15 1716    Clinical Impression Statement Pts skin looked good and no complaints from pt. She is happy with her progress and after discussing it and remeasuring her arm pt is happy to try to get measured this week or next, whatever works for the fitter. She is agreeable to daytime compression garments and a compression bra.    Pt will benefit from skilled therapeutic intervention in order to improve on the following deficits Increased edema;Pain   Rehab Potential Excellent   PT Frequency 3x / week   PT Duration 6 weeks   PT Treatment/Interventions Manual lymph drainage;Compression bandaging;Patient/family education   PT Next Visit Plan Continue complete decongestive therapy; continue equipment discussion (see clinical impression to stay consistent with patient)   Recommended Other Services Faxed pts demographics to [facility] today to see if she can get measured this week or next as pt is pleased with her progress thus far.   Consulted and Agree with Plan of Care Patient        Problem List Patient Active Problem List   Diagnosis Date Noted  . Need for prophylactic vaccination against Streptococcus pneumoniae (pneumococcus) 01/05/2014  . Need for prophylactic vaccination and inoculation against influenza 09/12/2013  . Impacted cerumen of right ear 04/20/2013  . Eustachian tube dysfunction 04/20/2013  . Otitis, externa, infective 04/20/2013  . TMJ (temporomandibular joint disorder) 04/15/2013  . Unspecified vitamin D deficiency 04/15/2013  . Hyperlipidemia 04/15/2013  . Hip pain 03/03/2013  . Depression 03/03/2013  . Bursitis of hip  03/03/2013    Otelia Limes, PTA 08/12/2015, 5:22 PM  [place], [place], [address] Phone: [phone]  Fax:  [phone]  Name: Holly Key MRN: [id number] Date of Birth: Feb 28, 1951

## 2015-08-14 ENCOUNTER — Ambulatory Visit: Payer: 59

## 2015-08-14 DIAGNOSIS — I890 Lymphedema, not elsewhere classified: Secondary | ICD-10-CM

## 2015-08-14 DIAGNOSIS — M79601 Pain in right arm: Secondary | ICD-10-CM

## 2015-08-14 NOTE — Therapy (Signed)
[place], [place], [address] Phone: [phone]   Fax:  [phone]  Physical Therapy Treatment  Patient Details  Name: Holly Key MRN: [id number] Date of Birth: 25-Jun-1951 Referring Provider: Dr. Dustin Folks  Encounter Date: 08/14/2015      PT End of Session - 08/14/15 1019    Visit Number 7   Number of Visits 19   Date for PT Re-Evaluation 09/11/15   PT Start Time 0956   PT Stop Time 1019   PT Time Calculation (min) 23 min   Activity Tolerance Patient tolerated treatment well   Behavior During Therapy [facility] for tasks assessed/performed      Past Medical History  Diagnosis Date  . Breast cancer [facility]) 2003    Right Breast   . Atrial premature complex   . Colon polyp   . Depression   . Anxiety   . GERD (gastroesophageal reflux disease)   . Pneumonia     Past Surgical History  Procedure Laterality Date  . Mastectomy Right 2003  . Breast reconstruction Right 2004  . Gastric fundoplication      There were no vitals filed for this visit.  Visit Diagnosis:  Lymphedema of arm  Diffuse pain in right upper extremity      Subjective Assessment - 08/14/15 0944    Subjective They called me last night to get measured so I told them I could do 930 today but I didn't think to tell them I had an appt with you guys at that time. I haven't had the sharp pain under my arm in about a week, I'm excited about that.    Currently in Pain? Yes   Pain Score 4    Pain Location Arm   Pain Orientation Right;Upper   Pain Descriptors / Indicators Aching;Tightness   Pain Type Chronic pain   Pain Onset More than a month ago   Pain Frequency Intermittent   Aggravating Factors  not sure   Pain Relieving Factors getting the manual lymph drainage                         [facility] Adult PT Treatment/Exercise - 08/14/15 0001    Manual Therapy   Manual therapy comments Only had time for  short manual lymph drainage and bandage pt today as she didnt realize to not schedule getting measured for her garments during her appt.    Manual Lymphatic Drainage (MLD) In Supine short neck, superficial and deep abdominals, and Lt axilla and Rt inguinal nodes and anterior inter-axillary and Rt axillo-inguinal anastomosisi today is what time allowed.    Compression Bandaging Lotion applied, then bandaging with medium tg soft,  Elastomull to fingers 2-5, Artiflex x 1 and 1/2" gray foam at anterior elbow; 1-6 cm,. 1-10 cm., and 1-12 cm. short stretch bandage from hand to shoulder of right UE.                   Short Term Clinic Goals - 08/14/15 0949    CC Short Term Goal  #1   Title Patient will be knowledgeable about lymphedema risk reduction.   Status Achieved   CC Short Term Goal  #2   Title Right arm circumference at 10 cm. proximal to olecranon will reduce by 1 cm. to 37.1 cm.  36.5 cm attained on 08/12/15   Status Achieved   CC Short Term Goal  #3   Title Right arm  circumference at 10 cm. proximal to ulnar styloid will reduce by 1.5 cm. to 26.7  26.1 cm attained 08/12/15   Status Achieved   CC Short Term Goal  #4   Title Pt. will report pain decrease of at least 50% in right UE  60% improvement thus far 08/14/15   Status Achieved             Long Term Clinic Goals - 08/14/15 0956    CC Long Term Goal  #1   Title Right arm circumference at 10 cm. proximal to olecranon will reduce by 1.5 cm. to 36.5  36.5 cm attained 08/12/15   Status Achieved   CC Long Term Goal  #2   Title Right arm circumference at 10 cm. proximal to ulnar styloid will reduce by 2 cm. to 26.2  26.1 cm attained 08/12/15   Status Achieved   CC Long Term Goal  #3   Title Patient will be knowledgeable about how and where to obtain daytime and nighttime compression garments  Pt measured today, 08/14/15   Status Achieved   CC Long Term Goal  #4   Title Pt. will report pain decrease of at least 75% in  right UE.  60% improvement thus far 08/14/15   Status Partially Met            Plan - 08/14/15 1020    Clinical Impression Statement Pt got measured for her compression garments today, day and night and Belisse bra. She is tolerating bandaging well.    Pt will benefit from skilled therapeutic intervention in order to improve on the following deficits Increased edema;Pain   Rehab Potential Excellent   PT Frequency 3x / week   PT Duration 6 weeks   PT Treatment/Interventions Manual lymph drainage;Compression bandaging;Patient/family education   PT Next Visit Plan Continue complete decongestive therapy; continue equipment discussion (see clinical impression to stay consistent with patient)   Recommended Other Services Pt was measured for all compression garments today.   Consulted and Agree with Plan of Care Patient        Problem List Patient Active Problem List   Diagnosis Date Noted  . Need for prophylactic vaccination against Streptococcus pneumoniae (pneumococcus) 01/05/2014  . Need for prophylactic vaccination and inoculation against influenza 09/12/2013  . Impacted cerumen of right ear 04/20/2013  . Eustachian tube dysfunction 04/20/2013  . Otitis, externa, infective 04/20/2013  . TMJ (temporomandibular joint disorder) 04/15/2013  . Unspecified vitamin D deficiency 04/15/2013  . Hyperlipidemia 04/15/2013  . Hip pain 03/03/2013  . Depression 03/03/2013  . Bursitis of hip 03/03/2013    Otelia Limes, PTA 08/14/2015, 10:26 AM  [place], [place], [address] Phone: [phone]   Fax:  [phone]  Name: Holly Key MRN: [id number] Date of Birth: Dec 18, 1950

## 2015-08-17 ENCOUNTER — Ambulatory Visit: Payer: 59 | Admitting: Physical Therapy

## 2015-08-17 ENCOUNTER — Encounter: Payer: Self-pay | Admitting: Physical Therapy

## 2015-08-17 DIAGNOSIS — I890 Lymphedema, not elsewhere classified: Secondary | ICD-10-CM

## 2015-08-17 DIAGNOSIS — M79621 Pain in right upper arm: Secondary | ICD-10-CM

## 2015-08-17 DIAGNOSIS — M79601 Pain in right arm: Secondary | ICD-10-CM

## 2015-08-17 NOTE — Therapy (Signed)
[place], [place], [address] Phone: [phone]   Fax:  [phone]  Physical Therapy Treatment  Patient Details  Name: Holly Key MRN: [id number] Date of Birth: March 15, 1951 Referring Provider: Dr. Dustin Folks  Encounter Date: 08/17/2015      PT End of Session - 08/17/15 1203    Visit Number 8   Number of Visits 19   Date for PT Re-Evaluation 09/11/15   PT Start Time 1106   PT Stop Time 1159   PT Time Calculation (min) 53 min   Activity Tolerance Patient tolerated treatment well   Behavior During Therapy [facility] for tasks assessed/performed      Past Medical History  Diagnosis Date  . Breast cancer [facility]) 2003    Right Breast   . Atrial premature complex   . Colon polyp   . Depression   . Anxiety   . GERD (gastroesophageal reflux disease)   . Pneumonia     Past Surgical History  Procedure Laterality Date  . Mastectomy Right 2003  . Breast reconstruction Right 2004  . Gastric fundoplication      There were no vitals filed for this visit.  Visit Diagnosis:  Lymphedema of arm  Pain in right axilla  Diffuse pain in right upper extremity      Subjective Assessment - 08/17/15 1116    Subjective A little irritation at my wrist from the bandages but otherwise fine.  I kept them on.  I'd like to be measured to see if I'm making progress.  I got measured for my garments.   Pertinent History 2003 right mastectomy 5 sentinel nodes removed; then 6 rounds of chemo, no radiation.  Had been doing fine but a year and a half ago, went bowling.  After that had pain in right elbow and shoulder, then axilla, and then swelling that started proximal and moved down into the hand.  Both pain and swelling have worsened.  HHad breast reconstruction in 2004 with implant.  Had two basal cell skin cancers on chin and neck this year.  Blood pressure elevates to around 140 when she is in pain.  Two years ago  had pneumonia and was hospitalized for 5 days.     Patient Stated Goals less pain and swelling in right arm   Currently in Pain? No/denies               LYMPHEDEMA/ONCOLOGY QUESTIONNAIRE - 08/17/15 1117    Right Upper Extremity Lymphedema   15 cm Proximal to Olecranon Process 37 cm   10 cm Proximal to Olecranon Process 35.9 cm   Olecranon Process 28.2 cm   15 cm Proximal to Ulnar Styloid Process 28.4 cm   10 cm Proximal to Ulnar Styloid Process 26.2 cm   Just Proximal to Ulnar Styloid Process 17.2 cm   Across Hand at [employer] 17.7 cm   At [place] of 2nd Digit 5.8 cm                  [facility] Adult PT Treatment/Exercise - 08/17/15 0001    Manual Therapy   Manual Lymphatic Drainage (MLD) In Supine short neck, superficial and deep abdominals, and Lt axilla and Rt inguinal nodes and anterior inter-axillary and Rt axillo-inguinal anastomosis; right UE from hand to shoulder redicrecting along pathways.   Compression Bandaging Lotion applied, Allevyn pad applied to red area on lateral wrist; then bandaging with medium tg soft,  Elastomull to fingers 1-5, Artiflex  x 1; 1-6 cm,. 1-10 cm., and 1-12 cm. short stretch bandage from hand to shoulder of right UE.                PT Education - 08/17/15 1202    Education provided Yes   Education Details Showed [place] bra and compression glove for pt to have better understanding of what she is getting.  Also discussed pelvic floor rehab for c/o vaginal stenosis and issued information for her to discuss with her MD and a possible referral to Earlie Counts, PT.   Person(s) Educated Patient   Methods Explanation;Handout   Comprehension Verbalized understanding           Short Term Clinic Goals - 08/14/15 [phone]    CC Short Term Goal  #1   Title Patient will be knowledgeable about lymphedema risk reduction.   Status Achieved   CC Short Term Goal  #2   Title Right arm circumference at 10 cm. proximal to olecranon will reduce  by 1 cm. to 37.1 cm.  36.5 cm attained on 08/12/15   Status Achieved   CC Short Term Goal  #3   Title Right arm circumference at 10 cm. proximal to ulnar styloid will reduce by 1.5 cm. to 26.7  26.1 cm attained 08/12/15   Status Achieved   CC Short Term Goal  #4   Title Pt. will report pain decrease of at least 50% in right UE  60% improvement thus far 08/14/15   Status Achieved             Long Term Clinic Goals - 08/14/15 0956    CC Long Term Goal  #1   Title Right arm circumference at 10 cm. proximal to olecranon will reduce by 1.5 cm. to 36.5  36.5 cm attained 08/12/15   Status Achieved   CC Long Term Goal  #2   Title Right arm circumference at 10 cm. proximal to ulnar styloid will reduce by 2 cm. to 26.2  26.1 cm attained 08/12/15   Status Achieved   CC Long Term Goal  #3   Title Patient will be knowledgeable about how and where to obtain daytime and nighttime compression garments  Pt measured today, 08/14/15   Status Achieved   CC Long Term Goal  #4   Title Pt. will report pain decrease of at least 75% in right UE.  60% improvement thus far 08/14/15   Status Partially Met            Plan - 08/17/15 1203    Clinical Impression Statement Patient's measurements appear to be better in most areas compared with last treatment.  She was measured last visit for bra and garments and is ready for those when they arrive.  She discussed concerns of long term side effects from Arimidex primarily related to vaginal stenosis and dryness.  Explained treatment options available in PT with our pelvic floor therapist and she was happy to learn about that.  She will benefit from continued PT until garments arrive.   Pt will benefit from skilled therapeutic intervention in order to improve on the following deficits Increased edema;Pain   Rehab Potential Excellent   PT Frequency 3x / week   PT Duration 6 weeks   PT Treatment/Interventions Manual lymph drainage;Compression  bandaging;Patient/family education   PT Next Visit Plan Continue complete decongestive therapyuntil garments arrive   PT Home Exercise Plan UE remedial lymphedema exercise   Consulted and Agree with Plan of Care Patient  Problem List Patient Active Problem List   Diagnosis Date Noted  . Need for prophylactic vaccination against Streptococcus pneumoniae (pneumococcus) 01/05/2014  . Need for prophylactic vaccination and inoculation against influenza 09/12/2013  . Impacted cerumen of right ear 04/20/2013  . Eustachian tube dysfunction 04/20/2013  . Otitis, externa, infective 04/20/2013  . TMJ (temporomandibular joint disorder) 04/15/2013  . Unspecified vitamin D deficiency 04/15/2013  . Hyperlipidemia 04/15/2013  . Hip pain 03/03/2013  . Depression 03/03/2013  . Bursitis of hip 03/03/2013    Annia Friendly, PT 08/17/2015 12:10 PM   [place], [place], [address] Phone: [phone]   Fax:  [phone]  Name: Holly Key MRN: [id number] Date of Birth: [birth year]

## 2015-08-19 ENCOUNTER — Ambulatory Visit: Payer: 59 | Admitting: Physical Therapy

## 2015-08-19 DIAGNOSIS — I890 Lymphedema, not elsewhere classified: Secondary | ICD-10-CM | POA: Diagnosis not present

## 2015-08-19 DIAGNOSIS — M79601 Pain in right arm: Secondary | ICD-10-CM

## 2015-08-19 DIAGNOSIS — M79621 Pain in right upper arm: Secondary | ICD-10-CM

## 2015-08-19 NOTE — Therapy (Signed)
[place], [place], [address] Phone: [phone]   Fax:  [phone]  Physical Therapy Treatment  Patient Details  Name: Holly Key MRN: [id number] Date of Birth: 11/18/1950 Referring Provider: Dr. Dustin Folks  Encounter Date: 08/19/2015      PT End of Session - 08/19/15 1248    Visit Number 9   Number of Visits 19   Date for PT Re-Evaluation 09/11/15   PT Start Time 0845   PT Stop Time 1000   PT Time Calculation (min) 75 min   Activity Tolerance Patient tolerated treatment well   Behavior During Therapy [facility] for tasks assessed/performed      Past Medical History  Diagnosis Date  . Breast cancer [facility]) 2003    Right Breast   . Atrial premature complex   . Colon polyp   . Depression   . Anxiety   . GERD (gastroesophageal reflux disease)   . Pneumonia     Past Surgical History  Procedure Laterality Date  . Mastectomy Right 2003  . Breast reconstruction Right 2004  . Gastric fundoplication      There were no vitals filed for this visit.  Visit Diagnosis:  Lymphedema of arm  Pain in right axilla  Diffuse pain in right upper extremity      Subjective Assessment - 08/19/15 0856    Subjective Pt reports she had increased pain in right upper arm, shoulder and back since being wrapped last time  she had to take 3 advill yesterday and upper arm is sore to the touch. Feels better but still hurts once bandages removed. No warm or exceesive redness at skin    Pertinent History 2003 right mastectomy 5 sentinel nodes removed; then 6 rounds of chemo, no radiation.  Had been doing fine but a year and a half ago, went bowling.  After that had pain in right elbow and shoulder, then axilla, and then swelling that started proximal and moved down into the hand.  Both pain and swelling have worsened.  HHad breast reconstruction in 2004 with implant.  Had two basal cell skin cancers on chin and  neck this year.  Blood pressure elevates to around 140 when she is in pain.  Two years ago had pneumonia and was hospitalized for 5 days.     Patient Stated Goals less pain and swelling in right arm   Pain Score 8    Pain Location Arm   Pain Orientation Right;Upper   Pain Descriptors / Indicators Aching;Pressure;Sore   Aggravating Factors  compression bandages on arm                LYMPHEDEMA/ONCOLOGY QUESTIONNAIRE - 08/19/15 0900    Right Upper Extremity Lymphedema   15 cm Proximal to Olecranon Process 37.5 cm   10 cm Proximal to Olecranon Process 37 cm   Olecranon Process 28.6 cm   15 cm Proximal to Ulnar Styloid Process 28.7 cm   10 cm Proximal to Ulnar Styloid Process 26.6 cm   Just Proximal to Ulnar Styloid Process 17.3 cm   Across Hand at [employer] 17.8 cm   At [place] of 2nd Digit 5.7 cm                  [facility] Adult PT Treatment/Exercise - 08/19/15 0001    Manual Therapy   Manual Lymphatic Drainage (MLD) In Supine short neck, superficial and deep abdominals, and Lt axilla and Rt inguinal nodes and anterior  inter-axillary and Rt axillo-inguinal anastomosis; right UE from hand to shoulder redicrecting along pathways. then to sidelying for lateral trunk and axilo-inguinal anastamosis, posterior interaxillay anastamisi, back. axilla Extra time spend on all today with pt reporting a reduction in pain to level 5 after treatment                    Short Term Clinic Goals - 08/14/15 0949    CC Short Term Goal  #1   Title Patient will be knowledgeable about lymphedema risk reduction.   Status Achieved   CC Short Term Goal  #2   Title Right arm circumference at 10 cm. proximal to olecranon will reduce by 1 cm. to 37.1 cm.  36.5 cm attained on 08/12/15   Status Achieved   CC Short Term Goal  #3   Title Right arm circumference at 10 cm. proximal to ulnar styloid will reduce by 1.5 cm. to 26.7  26.1 cm attained 08/12/15   Status Achieved   CC Short Term  Goal  #4   Title Pt. will report pain decrease of at least 50% in right UE  60% improvement thus far 08/14/15   Status Achieved             Long Term Clinic Goals - 08/14/15 0956    CC Long Term Goal  #1   Title Right arm circumference at 10 cm. proximal to olecranon will reduce by 1.5 cm. to 36.5  36.5 cm attained 08/12/15   Status Achieved   CC Long Term Goal  #2   Title Right arm circumference at 10 cm. proximal to ulnar styloid will reduce by 2 cm. to 26.2  26.1 cm attained 08/12/15   Status Achieved   CC Long Term Goal  #3   Title Patient will be knowledgeable about how and where to obtain daytime and nighttime compression garments  Pt measured today, 08/14/15   Status Achieved   CC Long Term Goal  #4   Title Pt. will report pain decrease of at least 75% in right UE.  60% improvement thus far 08/14/15   Status Partially Met            Plan - 08/19/15 1248    Clinical Impression Statement Pt comes in today with complaints of pain for 2 days, but she did not remove her bandages at home.  They were removed today and skin was checked and did not appear to be warm or have extra red areas, alhtough area at wrist was reddened as it had been. She had increases in most measurements except for hand.  She received some pain relief from extra manual lymph drainage as time allowed it, and it was decided not to reapply compression until reassessment onFriday.  She will do exercise and self manual lymph drainage at home    PT Next Visit Plan Reassess and remeasure arm, reapply compression if indicated. Review self manual lymph drainage technique   Consulted and Agree with Plan of Care Patient        Problem List Patient Active Problem List   Diagnosis Date Noted  . Need for prophylactic vaccination against Streptococcus pneumoniae (pneumococcus) 01/05/2014  . Need for prophylactic vaccination and inoculation against influenza 09/12/2013  . Impacted cerumen of right ear 04/20/2013   . Eustachian tube dysfunction 04/20/2013  . Otitis, externa, infective 04/20/2013  . TMJ (temporomandibular joint disorder) 04/15/2013  . Unspecified vitamin D deficiency 04/15/2013  . Hyperlipidemia 04/15/2013  . Hip pain  03/03/2013  . Depression 03/03/2013  . Bursitis of hip 03/03/2013   Donato Heinz. Owens Shark, PT  08/19/2015, 12:55 PM  [place], [place], [address] Phone: [phone]   Fax:  [phone]  Name: Holly Key MRN: [id number] Date of Birth: 1950-11-28

## 2015-08-21 ENCOUNTER — Ambulatory Visit: Payer: 59 | Admitting: Physical Therapy

## 2015-08-21 DIAGNOSIS — I890 Lymphedema, not elsewhere classified: Secondary | ICD-10-CM | POA: Diagnosis not present

## 2015-08-21 DIAGNOSIS — M79621 Pain in right upper arm: Secondary | ICD-10-CM

## 2015-08-21 DIAGNOSIS — M79601 Pain in right arm: Secondary | ICD-10-CM

## 2015-08-21 NOTE — Therapy (Signed)
[place], [place], [address] Phone: [phone]   Fax:  [phone]  Physical Therapy Treatment  Patient Details  Name: Holly Key MRN: [id number] Date of Birth: 08/31/1951 Referring Provider: Dr. Dustin Folks  Encounter Date: 08/21/2015      PT End of Session - 08/21/15 1229    Visit Number 10   Number of Visits 19   Date for PT Re-Evaluation 09/11/15   PT Start Time 0930   PT Stop Time 1015   PT Time Calculation (min) 45 min   Activity Tolerance Patient tolerated treatment well   Behavior During Therapy [facility] for tasks assessed/performed      Past Medical History  Diagnosis Date  . Breast cancer [facility]) 2003    Right Breast   . Atrial premature complex   . Colon polyp   . Depression   . Anxiety   . GERD (gastroesophageal reflux disease)   . Pneumonia     Past Surgical History  Procedure Laterality Date  . Mastectomy Right 2003  . Breast reconstruction Right 2004  . Gastric fundoplication      There were no vitals filed for this visit.  Visit Diagnosis:  Lymphedema of arm  Pain in right axilla  Diffuse pain in right upper extremity      Subjective Assessment - 08/21/15 0937    Subjective pt reports that she has been doing her manual lymph drainage and exercises at home, but the swelling has returned. She also has increased pain in elbow at site of later epicondyle . She has been treated for tennis elbow in the past and recently got a shot for it.    Currently in Pain? Yes   Pain Score 8    Pain Location Elbow   Pain Orientation Right;Lateral   Pain Descriptors / Indicators Aching;Sharp   Pain Type Acute pain   Pain Onset Yesterday   Aggravating Factors  pressure on lateral elbow    Pain Relieving Factors advil               LYMPHEDEMA/ONCOLOGY QUESTIONNAIRE - 08/21/15 0940    Right Upper Extremity Lymphedema   15 cm Proximal to Olecranon Process 37.6 cm    10 cm Proximal to Olecranon Process 38 cm   Olecranon Process 29 cm   15 cm Proximal to Ulnar Styloid Process 29.8 cm   10 cm Proximal to Ulnar Styloid Process 27.5 cm   Just Proximal to Ulnar Styloid Process 17.1 cm   Across Hand at [employer] 18.5 cm   At [place] of 2nd Digit 5.7 cm                  [facility] Adult PT Treatment/Exercise - 08/21/15 0001    Manual Therapy   Manual Lymphatic Drainage (MLD) In Supine short neck, superficial and deep abdominals, and Lt axilla and Rt inguinal nodes and anterior inter-axillary and Rt axillo-inguinal anastomosis; right UE from hand to shoulder redicrecting along pathways.   Compression Bandaging Lotion applied, Allevyn pad applied to red area on lateral wrist and sore area at lateral epicondylethen bandaging with medium tg soft,  Elastomull to fingers 2-4, Artiflex x 2; 1-6 cm,. 1-10 cm., and 1-12 cm. short stretch bandage from hand to shoulder of right UE.                PT Education - 08/21/15 1229    Education provided Yes   Education Details talked about compression  pump and pt wants to pursue Flex[employer]    Person(s) Educated Patient   Methods Explanation   Comprehension Verbalized understanding           Short Term Clinic Goals - 08/14/15 0949    CC Short Term Goal  #1   Title Patient will be knowledgeable about lymphedema risk reduction.   Status Achieved   CC Short Term Goal  #2   Title Right arm circumference at 10 cm. proximal to olecranon will reduce by 1 cm. to 37.1 cm.  36.5 cm attained on 08/12/15   Status Achieved   CC Short Term Goal  #3   Title Right arm circumference at 10 cm. proximal to ulnar styloid will reduce by 1.5 cm. to 26.7  26.1 cm attained 08/12/15   Status Achieved   CC Short Term Goal  #4   Title Pt. will report pain decrease of at least 50% in right UE  60% improvement thus far 08/14/15   Status Achieved             Long Term Clinic Goals - 08/14/15 0956    CC Long Term  Goal  #1   Title Right arm circumference at 10 cm. proximal to olecranon will reduce by 1.5 cm. to 36.5  36.5 cm attained 08/12/15   Status Achieved   CC Long Term Goal  #2   Title Right arm circumference at 10 cm. proximal to ulnar styloid will reduce by 2 cm. to 26.2  26.1 cm attained 08/12/15   Status Achieved   CC Long Term Goal  #3   Title Patient will be knowledgeable about how and where to obtain daytime and nighttime compression garments  Pt measured today, 08/14/15   Status Achieved   CC Long Term Goal  #4   Title Pt. will report pain decrease of at least 75% in right UE.  60% improvement thus far 08/14/15   Status Partially Met            Plan - 08/21/15 1230    Clinical Impression Statement Pt had increased swelling in arm today after not having bandages on  She has increased pain in lateral elbow today, so extra foam applies to this area under compression wrapping and patinet advised to remove bandages if she has pain.  She is discouraged as she has not been able to control her swelling with bandageing and exercise and is begiinning to feel more fullness at lateral trunk and axilla.  Of note, she has histtory of 2 abdominal sugeries and has noticed increased swelling in her abdomen. FAxed information to [employer] per her request.    Clinical Impairments Affecting Rehab Potential previous abdominal surgeries and breast surgeries    PT Next Visit Plan Reassess and remeasure arm, reapply compression if indicated. Review self manual lymph drainage technique        Problem List Patient Active Problem List   Diagnosis Date Noted  . Need for prophylactic vaccination against Streptococcus pneumoniae (pneumococcus) 01/05/2014  . Need for prophylactic vaccination and inoculation against influenza 09/12/2013  . Impacted cerumen of right ear 04/20/2013  . Eustachian tube dysfunction 04/20/2013  . Otitis, externa, infective 04/20/2013  . TMJ (temporomandibular joint disorder)  04/15/2013  . Unspecified vitamin D deficiency 04/15/2013  . Hyperlipidemia 04/15/2013  . Hip pain 03/03/2013  . Depression 03/03/2013  . Bursitis of hip 03/03/2013    [place]wood Levo 08/21/2015, 12:35 PM  [place]  [place], [place], [address] Phone: [phone]   Fax:  [phone]  Name: KIANDRA SANGUINETTI MRN: [id number] Date of Birth: 05/09/1951

## 2015-08-24 ENCOUNTER — Ambulatory Visit: Payer: 59 | Admitting: Physical Therapy

## 2015-08-24 DIAGNOSIS — M79601 Pain in right arm: Secondary | ICD-10-CM

## 2015-08-24 DIAGNOSIS — I890 Lymphedema, not elsewhere classified: Secondary | ICD-10-CM

## 2015-08-24 NOTE — Patient Instructions (Signed)
Remove bandages if painful. 

## 2015-08-24 NOTE — Therapy (Signed)
[place], [place], [address] Phone: [phone]   Fax:  [phone]  Physical Therapy Treatment  Patient Details  Name: Holly Key MRN: [id number] Date of Birth: 1951/09/08 Referring Provider: Dr. Dustin Folks  Encounter Date: 08/24/2015      PT End of Session - 08/24/15 1355    Visit Number 11   Number of Visits 19   Date for PT Re-Evaluation 09/11/15   PT Start Time 1310  patient late today   PT Stop Time 1350   PT Time Calculation (min) 40 min   Activity Tolerance Patient tolerated treatment well   Behavior During Therapy [facility] for tasks assessed/performed      Past Medical History  Diagnosis Date  . Breast cancer [facility]) 2003    Right Breast   . Atrial premature complex   . Colon polyp   . Depression   . Anxiety   . GERD (gastroesophageal reflux disease)   . Pneumonia     Past Surgical History  Procedure Laterality Date  . Mastectomy Right 2003  . Breast reconstruction Right 2004  . Gastric fundoplication      There were no vitals filed for this visit.  Visit Diagnosis:  Lymphedema of arm  Diffuse pain in right upper extremity      Subjective Assessment - 08/24/15 1311    Subjective Did well with the bandages feeling comfortable; took bandages off Sunday evening (as therapist said she could) and the arm looked fabulous--reduced top to bottom; then the pain returned and the swelling is back now.  Is planning to see her orthopedist about elbow pain if she can get in.  Will have a pump demo on Thursday this week.   Currently in Pain? Yes   Pain Score 8    Pain Location Elbow   Pain Orientation Right   Aggravating Factors  taking bandages off   Pain Relieving Factors advil                         [facility] Adult PT Treatment/Exercise - 08/24/15 0001    Manual Therapy   Manual Lymphatic Drainage (MLD) In Supine short neck, superficial and deep abdominals,  and Lt axilla and Rt inguinal nodes and anterior inter-axillary and Rt axillo-inguinal anastomosis; right UE from hand to shoulder redicrecting along pathways.   Compression Bandaging Biotone applied, Allevyn pad applied to lateral wrist and sore area at lateral epicondyle, 1/2 inch gray foam at dorsal hand, wrist and elbow over Allevyn areas, and also anterior elbow, then bandaging with medium tg soft,  Elastomull to fingers 2-4, Artiflex x 2; 1-6 cm,. 1-10 cm., and 1-12 cm. short stretch bandage from hand to shoulder of right UE.                   Short Term Clinic Goals - 08/14/15 0949    CC Short Term Goal  #1   Title Patient will be knowledgeable about lymphedema risk reduction.   Status Achieved   CC Short Term Goal  #2   Title Right arm circumference at 10 cm. proximal to olecranon will reduce by 1 cm. to 37.1 cm.  36.5 cm attained on 08/12/15   Status Achieved   CC Short Term Goal  #3   Title Right arm circumference at 10 cm. proximal to ulnar styloid will reduce by 1.5 cm. to 26.7  26.1 cm attained 08/12/15   Status Achieved  CC Short Term Goal  #4   Title Pt. will report pain decrease of at least 50% in right UE  60% improvement thus far 08/14/15   Status Achieved             Long Term Clinic Goals - 08/14/15 0956    CC Long Term Goal  #1   Title Right arm circumference at 10 cm. proximal to olecranon will reduce by 1.5 cm. to 36.5  36.5 cm attained 08/12/15   Status Achieved   CC Long Term Goal  #2   Title Right arm circumference at 10 cm. proximal to ulnar styloid will reduce by 2 cm. to 26.2  26.1 cm attained 08/12/15   Status Achieved   CC Long Term Goal  #3   Title Patient will be knowledgeable about how and where to obtain daytime and nighttime compression garments  Pt measured today, 08/14/15   Status Achieved   CC Long Term Goal  #4   Title Pt. will report pain decrease of at least 75% in right UE.  60% improvement thus far 08/14/15   Status  Partially Met            Plan - 2015-09-04 1356    Clinical Impression Statement Did well with recent bandaging that included padding to sore areas; pain was relieved until bandages were removed last night.  Then pain increased as did arm swelling.  Patient reported the arm felt good once bandaged again today.   Pt will benefit from skilled therapeutic intervention in order to improve on the following deficits Increased edema;Pain   Rehab Potential Excellent   Clinical Impairments Affecting Rehab Potential previous abdominal surgeries and breast surgeries    PT Frequency 3x / week   PT Duration 6 weeks   PT Treatment/Interventions Manual lymph drainage;Compression bandaging;Patient/family education   PT Next Visit Plan Remeasure arm and reassess.  Review self-manual lymph drainage prn.  Continue complete decongestive therapy with padding for comfort.   PT Home Exercise Plan UE remedial lymphedema exercise   Recommended Other Services Emailed [facility] about patient wanting sleeve with no seam and about whether insurance has approved garment.   Consulted and Agree with Plan of Care Patient          G-Codes - 09-04-15 1401    Functional Assessment Tool Used clinical judgement   Functional Limitation Self care   Self Care Current Status [phone]) At least 20 percent but less than 40 percent impaired, limited or restricted   Self Care Goal Status (S9373) At least 1 percent but less than 20 percent impaired, limited or restricted      Problem List Patient Active Problem List   Diagnosis Date Noted  . Need for prophylactic vaccination against Streptococcus pneumoniae (pneumococcus) 01/05/2014  . Need for prophylactic vaccination and inoculation against influenza 09/12/2013  . Impacted cerumen of right ear 04/20/2013  . Eustachian tube dysfunction 04/20/2013  . Otitis, externa, infective 04/20/2013  . TMJ (temporomandibular joint disorder) 04/15/2013  . Unspecified vitamin D  deficiency 04/15/2013  . Hyperlipidemia 04/15/2013  . Hip pain 03/03/2013  . Depression 03/03/2013  . Bursitis of hip 03/03/2013    Lenna Hagarty 09/04/2015, 2:02 PM  [place], [place], [address] Phone: [phone]   Fax:  [phone]  Name: Holly Key MRN: [id number] Date of Birth: [birth year]  Physical Therapy Progress Note  Dates of Reporting Period: 07/29/15 to 2015/09/04  Objective Reports of Subjective Statement: Complete  decongestive therapy has resulted in reduced arm circumferences, but this has been complicated by pain from her right epicondylitis.  Objective Measurements: Please see updates above listed within the goals.  Goal Update: Please see goals listed above.   Plan: Continue complete decongestive therapy until garments are received.  She has already been measured for compression garments; will have lymphedema pump demonstration on 08/27/15.  Reason Skilled Services are Required: Lymphedema and patient not yet independent in self-care for that.   [name], PT 08/24/2015 2:02 PM

## 2015-08-26 ENCOUNTER — Ambulatory Visit: Payer: 59 | Admitting: Physical Therapy

## 2015-08-26 DIAGNOSIS — M79621 Pain in right upper arm: Secondary | ICD-10-CM

## 2015-08-26 DIAGNOSIS — I890 Lymphedema, not elsewhere classified: Secondary | ICD-10-CM | POA: Diagnosis not present

## 2015-08-26 DIAGNOSIS — M79601 Pain in right arm: Secondary | ICD-10-CM

## 2015-08-26 NOTE — Therapy (Signed)
[place], [place], [address] Phone: [phone]   Fax:  [phone]  Physical Therapy Treatment  Patient Details  Name: Holly Key MRN: [id number] Date of Birth: 08-27-1951 Referring Provider: Dr. Dustin Folks  Encounter Date: 08/26/2015      PT End of Session - 08/26/15 1254    Visit Number 12   Number of Visits 19   Date for PT Re-Evaluation 09/11/15   PT Start Time 0845   PT Stop Time 0956   PT Time Calculation (min) 71 min   Activity Tolerance Patient tolerated treatment well   Behavior During Therapy [facility] for tasks assessed/performed      Past Medical History  Diagnosis Date  . Breast cancer [facility]) 2003    Right Breast   . Atrial premature complex   . Colon polyp   . Depression   . Anxiety   . GERD (gastroesophageal reflux disease)   . Pneumonia     Past Surgical History  Procedure Laterality Date  . Mastectomy Right 2003  . Breast reconstruction Right 2004  . Gastric fundoplication      There were no vitals filed for this visit.  Visit Diagnosis:  Lymphedema of arm  Diffuse pain in right upper extremity  Pain in right axilla      Subjective Assessment - 08/26/15 0846    Subjective Got an appointment next Monday with her orthopedist about the elbow.  Bandages were uncomfortable, because the wrap was tighter last session; took them off this morning.   Currently in Pain? Yes   Pain Score 8    Pain Location Elbow   Pain Orientation Right   Pain Descriptors / Indicators Aching;Sharp   Aggravating Factors  lifting anything, even a cup of coffee with the right   Pain Relieving Factors advil sometimes takes a little bit of the edge off               LYMPHEDEMA/ONCOLOGY QUESTIONNAIRE - 08/26/15 0848    Right Upper Extremity Lymphedema   15 cm Proximal to Olecranon Process 37.2 cm   10 cm Proximal to Olecranon Process 38 cm   Olecranon Process 28.8 cm   15  cm Proximal to Ulnar Styloid Process 29.5 cm   10 cm Proximal to Ulnar Styloid Process 27.4 cm   Just Proximal to Ulnar Styloid Process 18.4 cm   Across Hand at [employer] 18.7 cm   At [place] of 2nd Digit 5.8 cm                  [facility] Adult PT Treatment/Exercise - 08/26/15 0001    Self-Care   Other Self-Care Comments  Lengthy discussion with patient about both daytime and nighttime garments options, showing her the samples we had and having her try some where appropriate, to help her choose garments she will use.    Manual Therapy   Compression Bandaging Lotion applied, Allevyn pad applied to lateral wrist and sore area at lateral epicondyle, TG soft, 1/2 inch gray foam at dorsal hand, wrist and elbow over Allevyn areas, and also anterior elbow, then bandaging,  Elastomull to fingers 2-4, Artiflex x 2; 1-6 cm,. 1-10 cm., and 1-12 cm. short stretch bandage from hand to shoulder of right UE.  Extra care taken (and bandaging adjusted more than once) to keep it from feeling too tight (and uncomfortable) and to avoid taking it all the way up to the axilla.  PT Education - 08/26/15 1254    Education provided Yes   Education Details about daytime and nighttime compression garments   Person(s) Educated Patient   Methods Explanation;Demonstration   Comprehension Verbalized understanding           Short Term Clinic Goals - 08/14/15 0949    CC Short Term Goal  #1   Title Patient will be knowledgeable about lymphedema risk reduction.   Status Achieved   CC Short Term Goal  #2   Title Right arm circumference at 10 cm. proximal to olecranon will reduce by 1 cm. to 37.1 cm.  36.5 cm attained on 08/12/15   Status Achieved   CC Short Term Goal  #3   Title Right arm circumference at 10 cm. proximal to ulnar styloid will reduce by 1.5 cm. to 26.7  26.1 cm attained 08/12/15   Status Achieved   CC Short Term Goal  #4   Title Pt. will report pain decrease of at  least 50% in right UE  60% improvement thus far 08/14/15   Status Achieved             Long Term Clinic Goals - 08/26/15 1706    CC Long Term Goal  #4   Title Pt. will report pain decrease of at least 75% in right UE.   Status On-going            Plan - 08/26/15 1255    Clinical Impression Statement Patient came in today reporting that the bandages had been uncomfortable since last session again and this has made her irritable.  We spent quite a bit of time discussing day- and nighttime compression garment options, which she was very eager to do, as she had not been shown samples and heard about options previously, and she wants some say in these things.    Pt will benefit from skilled therapeutic intervention in order to improve on the following deficits Increased edema;Pain   Rehab Potential Excellent   Clinical Impairments Affecting Rehab Potential previous abdominal surgeries and breast surgeries    PT Frequency 3x / week   PT Duration 6 weeks   PT Treatment/Interventions ADLs/Self Care Home Management;Patient/family education;Manual lymph drainage   PT Next Visit Plan Remeasure arm and reassess.  Review self-manual lymph drainage prn.  Continue complete decongestive therapy with padding for comfort.   Consulted and Agree with Plan of Care Patient        Problem List Patient Active Problem List   Diagnosis Date Noted  . Need for prophylactic vaccination against Streptococcus pneumoniae (pneumococcus) 01/05/2014  . Need for prophylactic vaccination and inoculation against influenza 09/12/2013  . Impacted cerumen of right ear 04/20/2013  . Eustachian tube dysfunction 04/20/2013  . Otitis, externa, infective 04/20/2013  . TMJ (temporomandibular joint disorder) 04/15/2013  . Unspecified vitamin D deficiency 04/15/2013  . Hyperlipidemia 04/15/2013  . Hip pain 03/03/2013  . Depression 03/03/2013  . Bursitis of hip 03/03/2013    SALISBURY,[name] 08/26/2015, 5:07  PM  [place], [place], [address] Phone: [phone]   Fax:  [phone]  Name: Holly Key MRN: [id number] Date of Birth: October 14, 1950    [name], PT 08/26/2015 5:07 PM

## 2015-08-28 ENCOUNTER — Ambulatory Visit: Payer: 59 | Admitting: Physical Therapy

## 2015-08-28 DIAGNOSIS — I890 Lymphedema, not elsewhere classified: Secondary | ICD-10-CM

## 2015-08-28 DIAGNOSIS — M79601 Pain in right arm: Secondary | ICD-10-CM

## 2015-08-28 NOTE — Therapy (Signed)
[place], [place], [address] Phone: [phone]   Fax:  [phone]  Physical Therapy Treatment  Patient Details  Name: Holly Key MRN: [id number] Date of Birth: [birth year] Referring Provider: Dr. Dustin Folks  Encounter Date: 08/28/2015      PT End of Session - 08/28/15 1304    Visit Number 13   Number of Visits 19   Date for PT Re-Evaluation 09/11/15   PT Start Time 0944   PT Stop Time 1033   PT Time Calculation (min) 49 min   Activity Tolerance Patient tolerated treatment well   Behavior During Therapy [facility] for tasks assessed/performed      Past Medical History  Diagnosis Date  . Breast cancer [facility]) 2003    Right Breast   . Atrial premature complex   . Colon polyp   . Depression   . Anxiety   . GERD (gastroesophageal reflux disease)   . Pneumonia     Past Surgical History  Procedure Laterality Date  . Mastectomy Right 2003  . Breast reconstruction Right 2004  . Gastric fundoplication      There were no vitals filed for this visit.  Visit Diagnosis:  Lymphedema of arm  Diffuse pain in right upper extremity      Subjective Assessment - 08/28/15 0948    Subjective Did okay with the bandages for about a day; last night had to take them off because it hurt so much.   Currently in Pain? Yes   Pain Score 8    Pain Location Elbow   Pain Orientation Right            [facility] PT Assessment - 08/28/15 0001    Observation/Other Assessments   Skin Integrity Pt. shows therapist an area of redness in right axilla, which she says she gets intermittently.  It may be slight maceration from underarm wetmess, since she has a number of skin folds there.           LYMPHEDEMA/ONCOLOGY QUESTIONNAIRE - 08/28/15 1010    Right Upper Extremity Lymphedema   15 cm Proximal to Olecranon Process 37.2 cm   10 cm Proximal to Olecranon Process 36.4 cm   Olecranon Process 28.4 cm   15  cm Proximal to Ulnar Styloid Process 29.1 cm   10 cm Proximal to Ulnar Styloid Process 27 cm   Just Proximal to Ulnar Styloid Process 18.1 cm   Across Hand at [employer] 18.9 cm   At [place] of 2nd Digit 5.9 cm                  [facility] Adult PT Treatment/Exercise - 08/28/15 0001    Manual Therapy   Edema Management circumference measurements taken   Manual Lymphatic Drainage (MLD) In Supine short neck, superficial and deep abdominals, and Lt axilla and Rt inguinal nodes and anterior inter-axillary and Rt axillo-inguinal anastomosis; right UE from hand to shoulder redicrecting along pathways.   Compression Bandaging of right UE with thick stockinette, 1/2 inch gray foam at posterior elbow, Artiflex x 1, and 1-6 cm. and 2-10 cm. short stretch bandages from hand to mid-upper arm, with lightest tension                   Short Term Clinic Goals - 08/14/15 0949    CC Short Term Goal  #1   Title Patient will be knowledgeable about lymphedema risk reduction.   Status Achieved   CC Short  Term Goal  #2   Title Right arm circumference at 10 cm. proximal to olecranon will reduce by 1 cm. to 37.1 cm.  36.5 cm attained on 08/12/15   Status Achieved   CC Short Term Goal  #3   Title Right arm circumference at 10 cm. proximal to ulnar styloid will reduce by 1.5 cm. to 26.7  26.1 cm attained 08/12/15   Status Achieved   CC Short Term Goal  #4   Title Pt. will report pain decrease of at least 50% in right UE  60% improvement thus far 08/14/15   Status Achieved             Long Term Clinic Goals - 08/28/15 1309    CC Long Term Goal  #4   Title Pt. will report pain decrease of at least 75% in right UE.   Status On-going            Plan - 08/28/15 1304    Clinical Impression Statement Patient with one day of arm feeling okay after last session, then increasing pain in right arm so that she needed to remove bandages last night.  Measurements today are improved compared  to initial evaluation, but progress has been limited since after the first couple weeks.  Patient is unhappy about slow progress with obtaining her compression garments and getting what she wants.  Will see orthopedist 8:00 Monday morning.  She did have a Flexitouch pump demo and is optimistic about that.   Pt will benefit from skilled therapeutic intervention in order to improve on the following deficits Increased edema;Pain   Rehab Potential Excellent   Clinical Impairments Affecting Rehab Potential previous abdominal surgeries and breast surgeries    PT Frequency 3x / week   PT Duration 6 weeks   PT Treatment/Interventions Manual lymph drainage;Compression bandaging   PT Next Visit Plan Continue complete decongestive therapy with padding for comfort.   PT Home Exercise Plan UE remedial lymphedema exercise   Consulted and Agree with Plan of Care Patient        Problem List Patient Active Problem List   Diagnosis Date Noted  . Need for prophylactic vaccination against Streptococcus pneumoniae (pneumococcus) 01/05/2014  . Need for prophylactic vaccination and inoculation against influenza 09/12/2013  . Impacted cerumen of right ear 04/20/2013  . Eustachian tube dysfunction 04/20/2013  . Otitis, externa, infective 04/20/2013  . TMJ (temporomandibular joint disorder) 04/15/2013  . Unspecified vitamin D deficiency 04/15/2013  . Hyperlipidemia 04/15/2013  . Hip pain 03/03/2013  . Depression 03/03/2013  . Bursitis of hip 03/03/2013    Saga Balthazar 08/28/2015, 1:10 PM  [place], [place], [address] Phone: [phone]   Fax:  [phone]  Name: Holly Key MRN: [id number] Date of Birth: [birth year]    [name], PT 08/28/2015 1:10 PM

## 2015-08-31 ENCOUNTER — Ambulatory Visit: Payer: 59

## 2015-08-31 DIAGNOSIS — M79621 Pain in right upper arm: Secondary | ICD-10-CM

## 2015-08-31 DIAGNOSIS — M79601 Pain in right arm: Secondary | ICD-10-CM

## 2015-08-31 DIAGNOSIS — I890 Lymphedema, not elsewhere classified: Secondary | ICD-10-CM

## 2015-08-31 NOTE — Therapy (Signed)
[place], [place], [address] Phone: [phone]   Fax:  [phone]  Physical Therapy Treatment  Patient Details  Name: Holly Key MRN: [id number] Date of Birth: 12/17/1950 Referring Provider: Dr. Dustin Folks  Encounter Date: 08/31/2015      PT End of Session - 08/31/15 1126    Visit Number 13   Number of Visits 19   Date for PT Re-Evaluation 09/11/15   PT Start Time 1105   PT Stop Time 1120   PT Time Calculation (min) 15 min      Past Medical History  Diagnosis Date  . Breast cancer [facility]) 2003    Right Breast   . Atrial premature complex   . Colon polyp   . Depression   . Anxiety   . GERD (gastroesophageal reflux disease)   . Pneumonia     Past Surgical History  Procedure Laterality Date  . Mastectomy Right 2003  . Breast reconstruction Right 2004  . Gastric fundoplication      There were no vitals filed for this visit.  Visit Diagnosis:  Lymphedema of arm  Pain in right axilla  Diffuse pain in right upper extremity      Subjective Assessment - 08/31/15 1125    Subjective I just got another cortisone shot in my elbow this morning and really want to give it the best shot to work bc the doctor said this is my last one and my Rt elbow has really been bothering me.  It just feels really achy right now from the shot.   Currently in Pain? No/denies                                    Short Term Clinic Goals - 08/14/15 0949    CC Short Term Goal  #1   Title Patient will be knowledgeable about lymphedema risk reduction.   Status Achieved   CC Short Term Goal  #2   Title Right arm circumference at 10 cm. proximal to olecranon will reduce by 1 cm. to 37.1 cm.  36.5 cm attained on 08/12/15   Status Achieved   CC Short Term Goal  #3   Title Right arm circumference at 10 cm. proximal to ulnar styloid will reduce by 1.5 cm. to 26.7  26.1 cm  attained 08/12/15   Status Achieved   CC Short Term Goal  #4   Title Pt. will report pain decrease of at least 50% in right UE  60% improvement thus far 08/14/15   Status Achieved             Long Term Clinic Goals - 08/28/15 1309    CC Long Term Goal  #4   Title Pt. will report pain decrease of at least 75% in right UE.   Status On-going            Plan - 08/31/15 1127    Clinical Impression Statement No treatment performed today as pt reported that once she got to thinking about it she didn't want to do anything that was going to disburse the shot quicker, so she opted for no treatment today and we will resume on Wednesday. Did call the fitter while pt was here and they are pending insurance approval and pt is going to call her insurance today and see if they can give her some kind of time frame.  Pt will benefit from skilled therapeutic intervention in order to improve on the following deficits Increased edema;Pain   Rehab Potential Excellent   Clinical Impairments Affecting Rehab Potential previous abdominal surgeries and breast surgeries    PT Frequency 3x / week   PT Duration 6 weeks   PT Next Visit Plan Resume complete decongestinve therapy next visit; measure circumference   Consulted and Agree with Plan of Care Patient        Problem List Patient Active Problem List   Diagnosis Date Noted  . Need for prophylactic vaccination against Streptococcus pneumoniae (pneumococcus) 01/05/2014  . Need for prophylactic vaccination and inoculation against influenza 09/12/2013  . Impacted cerumen of right ear 04/20/2013  . Eustachian tube dysfunction 04/20/2013  . Otitis, externa, infective 04/20/2013  . TMJ (temporomandibular joint disorder) 04/15/2013  . Unspecified vitamin D deficiency 04/15/2013  . Hyperlipidemia 04/15/2013  . Hip pain 03/03/2013  . Depression 03/03/2013  . Bursitis of hip 03/03/2013    Otelia Limes, PTA 08/31/2015, 11:35 AM  [place], [place], [address] Phone: [phone]   Fax:  [phone]  Name: Holly Key MRN: [id number] Date of Birth: 22-Sep-1951

## 2015-09-02 ENCOUNTER — Ambulatory Visit: Payer: 59 | Admitting: Physical Therapy

## 2015-09-02 DIAGNOSIS — M79621 Pain in right upper arm: Secondary | ICD-10-CM

## 2015-09-02 DIAGNOSIS — I890 Lymphedema, not elsewhere classified: Secondary | ICD-10-CM | POA: Diagnosis not present

## 2015-09-02 NOTE — Therapy (Signed)
[place], [place], [address] Phone: [phone]   Fax:  [phone]  Physical Therapy Treatment  Patient Details  Name: Holly Key MRN: [id number] Date of Birth: [birth year] Referring Provider: Dr. Dustin Folks  Encounter Date: 09/02/2015      PT End of Session - 09/02/15 1255    Visit Number 14   Number of Visits 19   Date for PT Re-Evaluation 09/11/15   PT Start Time 1022   PT Stop Time 1107   PT Time Calculation (min) 45 min   Activity Tolerance Patient tolerated treatment well   Behavior During Therapy [facility] for tasks assessed/performed      Past Medical History  Diagnosis Date  . Breast cancer [facility]) 2003    Right Breast   . Atrial premature complex   . Colon polyp   . Depression   . Anxiety   . GERD (gastroesophageal reflux disease)   . Pneumonia     Past Surgical History  Procedure Laterality Date  . Mastectomy Right 2003  . Breast reconstruction Right 2004  . Gastric fundoplication      There were no vitals filed for this visit.  Visit Diagnosis:  Lymphedema of arm  Pain in right axilla      Subjective Assessment - 09/02/15 1025    Subjective Injection has made a big difference; elbow feels so much better.  Hoyle Sauer called me this morning.     Currently in Pain? Yes   Pain Score 3    Pain Location Elbow  at injection site   Pain Orientation Right   Aggravating Factors  touching tender injection site   Pain Relieving Factors injection she had the other day               LYMPHEDEMA/ONCOLOGY QUESTIONNAIRE - 09/02/15 1028    Right Upper Extremity Lymphedema   15 cm Proximal to Olecranon Process 37.8 cm   10 cm Proximal to Olecranon Process 37.8 cm   Olecranon Process 28.5 cm   15 cm Proximal to Ulnar Styloid Process 28.5 cm   10 cm Proximal to Ulnar Styloid Process 27.1 cm   Just Proximal to Ulnar Styloid Process 18.7 cm   Across Hand at [employer] 18.8 cm   At [place] of 2nd Digit 5.8 cm   Other has been unwrapped for several days prior to these measurements                  [facility] Adult PT Treatment/Exercise - 09/02/15 0001    Self-Care   Other Self-Care Comments  Showed patient samples of swell spots, which Rexford Maus had mentioned to her for under her arm. Talked about her being able to try a foam pad between bra band and skin to see if this type of thing would help her at area inferior to axilla, and she said she would try this.   Manual Therapy   Edema Management circumference measurements taken   Manual Lymphatic Drainage (MLD) In Supine short neck, Lt axilla and Rt inguinal nodes and anterior inter-axillary and Rt axillo-inguinal anastomosis; right UE from fingers to shoulder redirecting along pathways; in left sidelying, posterior interaxillary anastomosis and right axillo-inguinal anastomosis.   Compression Bandaging Bandaging of right UE with TG soft, 1/2 inch gray foam strips flanking the area at her elbow where she got the injection Monday, Artiflex x 1, and 3 short stretch bandages from hand to mid-point of upper arm.  Trying  without finger wraps today.                   Short Term Clinic Goals - 08/14/15 0949    CC Short Term Goal  #1   Title Patient will be knowledgeable about lymphedema risk reduction.   Status Achieved   CC Short Term Goal  #2   Title Right arm circumference at 10 cm. proximal to olecranon will reduce by 1 cm. to 37.1 cm.  36.5 cm attained on 08/12/15   Status Achieved   CC Short Term Goal  #3   Title Right arm circumference at 10 cm. proximal to ulnar styloid will reduce by 1.5 cm. to 26.7  26.1 cm attained 08/12/15   Status Achieved   CC Short Term Goal  #4   Title Pt. will report pain decrease of at least 50% in right UE  60% improvement thus far 08/14/15   Status Achieved             Long Term Clinic Goals - 08/28/15 1309    CC Long Term Goal  #4   Title  Pt. will report pain decrease of at least 75% in right UE.   Status On-going            Plan - 09/02/15 1256    Clinical Impression Statement Some measurements were increased today, as patient has not been bandaged since getting her elbow injection two days ago.  Her pain is significantly reduced from the injection.  She did talk to the vendor about garments that she wants, and understands the insurance is the hold up with those right now.   Pt will benefit from skilled therapeutic intervention in order to improve on the following deficits Increased edema;Pain   Rehab Potential Excellent   Clinical Impairments Affecting Rehab Potential previous abdominal surgeries and breast surgeries    PT Frequency 3x / week   PT Duration 6 weeks   PT Treatment/Interventions Patient/family education;ADLs/Self Care Home Management;Manual lymph drainage;Compression bandaging   PT Next Visit Plan complete decongestive therapy   Consulted and Agree with Plan of Care Patient        Problem List Patient Active Problem List   Diagnosis Date Noted  . Need for prophylactic vaccination against Streptococcus pneumoniae (pneumococcus) 01/05/2014  . Need for prophylactic vaccination and inoculation against influenza 09/12/2013  . Impacted cerumen of right ear 04/20/2013  . Eustachian tube dysfunction 04/20/2013  . Otitis, externa, infective 04/20/2013  . TMJ (temporomandibular joint disorder) 04/15/2013  . Unspecified vitamin D deficiency 04/15/2013  . Hyperlipidemia 04/15/2013  . Hip pain 03/03/2013  . Depression 03/03/2013  . Bursitis of hip 03/03/2013    Gennaro Lizotte 09/02/2015, 12:58 PM  [place], [place], [address] Phone: [phone]   Fax:  [phone]  Name: Holly Key MRN: [id number] Date of Birth: 06/14/1951    [name], PT 09/02/2015 12:58 PM

## 2015-09-04 ENCOUNTER — Encounter: Payer: 59 | Admitting: Physical Therapy

## 2015-09-07 ENCOUNTER — Ambulatory Visit: Payer: 59 | Admitting: Physical Therapy

## 2015-09-07 DIAGNOSIS — I890 Lymphedema, not elsewhere classified: Secondary | ICD-10-CM | POA: Diagnosis not present

## 2015-09-07 DIAGNOSIS — M79601 Pain in right arm: Secondary | ICD-10-CM

## 2015-09-07 DIAGNOSIS — M79621 Pain in right upper arm: Secondary | ICD-10-CM

## 2015-09-07 NOTE — Therapy (Signed)
[place], [place], [address] Phone: [phone]   Fax:  [phone]  Physical Therapy Treatment  Patient Details  Name: Holly Key MRN: [id number] Date of Birth: February 04, 1951 Referring Provider: Dr. Dustin Folks  Encounter Date: 09/07/2015      PT End of Session - 09/07/15 1649    Visit Number 15   Number of Visits 19   Date for PT Re-Evaluation 09/11/15   PT Start Time [id number]  Late start due to vendor measurements.   PT Stop Time 1346   PT Time Calculation (min) 29 min   Activity Tolerance Patient tolerated treatment well   Behavior During Therapy WFL for tasks assessed/performed      Past Medical History  Diagnosis Date  . Breast cancer [facility]) 2003    Right Breast   . Atrial premature complex   . Colon polyp   . Depression   . Anxiety   . GERD (gastroesophageal reflux disease)   . Pneumonia     Past Surgical History  Procedure Laterality Date  . Mastectomy Right 2003  . Breast reconstruction Right 2004  . Gastric fundoplication      There were no vitals filed for this visit.  Visit Diagnosis:  Lymphedema of arm  Pain in right axilla  Diffuse pain in right upper extremity      Subjective Assessment - 09/07/15 1309    Subjective Bandages stayed on until Friday (two days later) and didn't really have pain.  Hand and forearm look good--less swollen.   Currently in Pain? No/denies                         [facility] Adult PT Treatment/Exercise - 09/07/15 0001    Self-Care   Other Self-Care Comments  Patient was measured by [employer] rep for her [employer] nighttime compression garment prior to therapy session.   Manual Therapy   Manual Lymphatic Drainage (MLD) In Supine short neck, superficial and deep abdominals, and Lt axilla and Rt inguinal nodes and anterior inter-axillary and Rt axillo-inguinal anastomosis; right UE from hand to shoulder redicrecting  along pathways.   Compression Bandaging Bandaging of right UE with TG soft, 1/2 inch gray foam strips flanking the area at her elbow where she got the injection Monday, Artiflex x 1, and 3 short stretch bandages from hand to mid-point of upper arm.  Trying without finger wraps today.                   Short Term Clinic Goals - 08/14/15 0949    CC Short Term Goal  #1   Title Patient will be knowledgeable about lymphedema risk reduction.   Status Achieved   CC Short Term Goal  #2   Title Right arm circumference at 10 cm. proximal to olecranon will reduce by 1 cm. to 37.1 cm.  36.5 cm attained on 08/12/15   Status Achieved   CC Short Term Goal  #3   Title Right arm circumference at 10 cm. proximal to ulnar styloid will reduce by 1.5 cm. to 26.7  26.1 cm attained 08/12/15   Status Achieved   CC Short Term Goal  #4   Title Pt. will report pain decrease of at least 50% in right UE  60% improvement thus far 08/14/15   Status Achieved             Long Term Clinic Goals - 08/28/15 1309  CC Long Term Goal  #4   Title Pt. will report pain decrease of at least 75% in right UE.   Status On-going            Plan - 09/07/15 1650    Clinical Impression Statement Patient was quite happy today, with her elbow feeling significantly less painful since the injection a week ago.  She also tolerated bandages better over the past few days, and felt her hand and arm did well.  Fingers did not swell despite not being bandaged this time.  She is making progreess toward having equipment that she needs for optimal self-management, with nighttime garment measured today, pump being shipped, and having been measured for daytime garments already.  She did also note benefit from using foam pad under side band of her bra just inferior to right axilla for decreasing swelling there.   Pt will benefit from skilled therapeutic intervention in order to improve on the following deficits Increased  edema;Pain   Rehab Potential Excellent   Clinical Impairments Affecting Rehab Potential previous abdominal surgeries and breast surgeries    PT Frequency 3x / week   PT Duration 6 weeks   PT Treatment/Interventions Manual lymph drainage;Compression bandaging   PT Next Visit Plan assess pain goal; complete decongestive therapy   Consulted and Agree with Plan of Care Patient        Problem List Patient Active Problem List   Diagnosis Date Noted  . Need for prophylactic vaccination against Streptococcus pneumoniae (pneumococcus) 01/05/2014  . Need for prophylactic vaccination and inoculation against influenza 09/12/2013  . Impacted cerumen of right ear 04/20/2013  . Eustachian tube dysfunction 04/20/2013  . Otitis, externa, infective 04/20/2013  . TMJ (temporomandibular joint disorder) 04/15/2013  . Unspecified vitamin D deficiency 04/15/2013  . Hyperlipidemia 04/15/2013  . Hip pain 03/03/2013  . Depression 03/03/2013  . Bursitis of hip 03/03/2013    SALISBURY,[name] 09/07/2015, 4:55 PM  [place], [place], [address] Phone: [phone]   Fax:  [phone]  Name: Holly Key MRN: [id number] Date of Birth: 01/27/1951    [name], PT 09/07/2015 4:55 PM

## 2015-09-09 ENCOUNTER — Ambulatory Visit: Payer: 59 | Admitting: Physical Therapy

## 2015-09-09 DIAGNOSIS — I890 Lymphedema, not elsewhere classified: Secondary | ICD-10-CM | POA: Diagnosis not present

## 2015-09-09 NOTE — Therapy (Signed)
[name], [place], [address] Phone: [phone]   Fax:  [phone]  Physical Therapy Treatment  Patient Details  Name: Holly Key MRN: [id number] Date of Birth: May 13, 1951 Referring Provider: Dr. Dustin Folks  Encounter Date: 09/09/2015      PT End of Session - 09/09/15 1606    Visit Number 16   Number of Visits 19   Date for PT Re-Evaluation 09/11/15   PT Start Time 1522   PT Stop Time 1600   PT Time Calculation (min) 38 min   Activity Tolerance Patient tolerated treatment well   Behavior During Therapy [facility] for tasks assessed/performed      Past Medical History  Diagnosis Date  . Breast cancer [facility]) 2003    Right Breast   . Atrial premature complex   . Colon polyp   . Depression   . Anxiety   . GERD (gastroesophageal reflux disease)   . Pneumonia     Past Surgical History  Procedure Laterality Date  . Mastectomy Right 2003  . Breast reconstruction Right 2004  . Gastric fundoplication      There were no vitals filed for this visit.  Visit Diagnosis:  Lymphedema of arm      Subjective Assessment - 09/09/15 1523    Subjective Took bandages off last night after they started drooping and bunching up.   Currently in Pain? No/denies               LYMPHEDEMA/ONCOLOGY QUESTIONNAIRE - 09/09/15 1524    Right Upper Extremity Lymphedema   15 cm Proximal to Olecranon Process 36.5 cm   10 cm Proximal to Olecranon Process 36 cm   Olecranon Process 27.9 cm   15 cm Proximal to Ulnar Styloid Process 28.2 cm   10 cm Proximal to Ulnar Styloid Process 26.6 cm   Just Proximal to Ulnar Styloid Process 18 cm   Across Hand at [employer] 18.8 cm   At [place] of 2nd Digit 5.8 cm                  [facility] Adult PT Treatment/Exercise - 09/09/15 0001    Manual Therapy   Edema Management circumference measurements taken   Manual Lymphatic Drainage (MLD) In Supine short  neck, superficial and deep abdominals, and Lt axilla and Rt inguinal nodes and anterior inter-axillary and Rt axillo-inguinal anastomosis; right UE from hand to shoulder redicrecting along pathways.   Compression Bandaging Bandaging of right UE with TG soft, Artiflex x 1, and 3 short stretch bandages from hand to mid-point of upper arm.  No finger wraps.                   Short Term Clinic Goals - 08/14/15 0949    CC Short Term Goal  #1   Title Patient will be knowledgeable about lymphedema risk reduction.   Status Achieved   CC Short Term Goal  #2   Title Right arm circumference at 10 cm. proximal to olecranon will reduce by 1 cm. to 37.1 cm.  36.5 cm attained on 08/12/15   Status Achieved   CC Short Term Goal  #3   Title Right arm circumference at 10 cm. proximal to ulnar styloid will reduce by 1.5 cm. to 26.7  26.1 cm attained 08/12/15   Status Achieved   CC Short Term Goal  #4   Title Pt. will report pain decrease of at least 50% in right UE  60% improvement thus far 08/14/15   Status Achieved             Long Term Clinic Goals - 08/28/15 1309    CC Long Term Goal  #4   Title Pt. will report pain decrease of at least 75% in right UE.   Status On-going            Plan - 09/09/15 1610    Clinical Impression Statement Nice circumference measurement reductions today.  Patient still pleased with decreased elbow pain, and anxious for all equipment to be in place in order to finish therapy.   Pt will benefit from skilled therapeutic intervention in order to improve on the following deficits Increased edema;Pain   Rehab Potential Excellent   Clinical Impairments Affecting Rehab Potential previous abdominal surgeries and breast surgeries    PT Frequency 3x / week   PT Duration 6 weeks   PT Treatment/Interventions Manual lymph drainage;Compression bandaging;Manual techniques   PT Next Visit Plan assess pain goal; complete decongestive therapy   PT Home Exercise Plan  UE remedial lymphedema exercise   Consulted and Agree with Plan of Care Patient        Problem List Patient Active Problem List   Diagnosis Date Noted  . Need for prophylactic vaccination against Streptococcus pneumoniae (pneumococcus) 01/05/2014  . Need for prophylactic vaccination and inoculation against influenza 09/12/2013  . Impacted cerumen of right ear 04/20/2013  . Eustachian tube dysfunction 04/20/2013  . Otitis, externa, infective 04/20/2013  . TMJ (temporomandibular joint disorder) 04/15/2013  . Unspecified vitamin D deficiency 04/15/2013  . Hyperlipidemia 04/15/2013  . Hip pain 03/03/2013  . Depression 03/03/2013  . Bursitis of hip 03/03/2013    SALISBURY,[name] 09/09/2015, 4:12 PM  [place], [place], [address] Phone: [phone]   Fax:  [phone]  Name: Holly Key MRN: [id number] Date of Birth: Jan 04, 1951    [name], PT 09/09/2015 4:12 PM

## 2015-09-11 ENCOUNTER — Ambulatory Visit: Payer: 59 | Attending: Hematology and Oncology | Admitting: Physical Therapy

## 2015-09-11 DIAGNOSIS — I890 Lymphedema, not elsewhere classified: Secondary | ICD-10-CM | POA: Diagnosis not present

## 2015-09-11 NOTE — Therapy (Signed)
[place], [place], [address] Phone: [phone]   Fax:  [phone]  Physical Therapy Treatment  Patient Details  Name: Holly Key MRN: [id number] Date of Birth: 1951-02-03 Referring Provider: Dr. Dustin Folks  Encounter Date: 09/11/2015      PT End of Session - 09/11/15 1011    Visit Number 17   Number of Visits 26   Date for PT Re-Evaluation 10/02/15   PT Start Time 0931   PT Stop Time 1010   PT Time Calculation (min) 39 min   Activity Tolerance Patient tolerated treatment well   Behavior During Therapy [facility] for tasks assessed/performed      Past Medical History  Diagnosis Date  . Breast cancer [facility]) 2003    Right Breast   . Atrial premature complex   . Colon polyp   . Depression   . Anxiety   . GERD (gastroesophageal reflux disease)   . Pneumonia     Past Surgical History  Procedure Laterality Date  . Mastectomy Right 2003  . Breast reconstruction Right 2004  . Gastric fundoplication      There were no vitals filed for this visit.  Visit Diagnosis:  Lymphedema of arm - Plan: PT plan of care cert/re-cert      Subjective Assessment - 09/11/15 0932    Subjective Took bandages off last night just to take a shower, but they did real well.  Pad under arm makes that really feel better--glad they'll be putting one in the bra.   Currently in Pain? No/denies                         [facility] Adult PT Treatment/Exercise - 09/11/15 0001    Manual Therapy   Manual Lymphatic Drainage (MLD) In Supine short neck, Lt axilla and Rt inguinal nodes and anterior inter-axillary and Rt axillo-inguinal anastomosis; right UE from fingers to shoulder redirecting along pathways; in left sidelying, posterior interaxillary anastomosis and right axillo-inguinal anastomosis.   Compression Bandaging Bandaging of right UE with TG soft, Artiflex x 1, and 3 short stretch bandages from hand  to mid-point of upper arm.  No finger wraps.                   Short Term Clinic Goals - 08/14/15 0949    CC Short Term Goal  #1   Title Patient will be knowledgeable about lymphedema risk reduction.   Status Achieved   CC Short Term Goal  #2   Title Right arm circumference at 10 cm. proximal to olecranon will reduce by 1 cm. to 37.1 cm.  36.5 cm attained on 08/12/15   Status Achieved   CC Short Term Goal  #3   Title Right arm circumference at 10 cm. proximal to ulnar styloid will reduce by 1.5 cm. to 26.7  26.1 cm attained 08/12/15   Status Achieved   CC Short Term Goal  #4   Title Pt. will report pain decrease of at least 50% in right UE  60% improvement thus far 08/14/15   Status Achieved             Long Term Clinic Goals - 09/11/15 1013    CC Long Term Goal  #1   Title Right arm circumference at 10 cm. proximal to olecranon will reduce by 1.5 cm. to 36.5   Status Achieved   CC Long Term Goal  #2   Title Right  arm circumference at 10 cm. proximal to ulnar styloid will reduce by 2 cm. to 26.2   Status Achieved   CC Long Term Goal  #3   Title Patient will be knowledgeable about how and where to obtain daytime and nighttime compression garments   Status Achieved   CC Long Term Goal  #4   Title Pt. will report pain decrease of at least 75% in right UE.   Baseline no pain today   Status Achieved            Plan - 09/11/15 1012    Clinical Impression Statement Patient is pleased with how her arm looks and feels today, as well as how the axilla area feels with use of the foam pad there. Fingers, hand and wrist look much more like opposite arm now.  Waiting for garments.   Pt will benefit from skilled therapeutic intervention in order to improve on the following deficits Increased edema;Pain   Rehab Potential Excellent   Clinical Impairments Affecting Rehab Potential previous abdominal surgeries and breast surgeries    PT Frequency 3x / week   PT Duration 6  weeks   PT Treatment/Interventions Manual lymph drainage;Compression bandaging;Manual techniques   PT Next Visit Plan assess pain goal; complete decongestive therapy   PT Home Exercise Plan UE remedial lymphedema exercise        Problem List Patient Active Problem List   Diagnosis Date Noted  . Need for prophylactic vaccination against Streptococcus pneumoniae (pneumococcus) 01/05/2014  . Need for prophylactic vaccination and inoculation against influenza 09/12/2013  . Impacted cerumen of right ear 04/20/2013  . Eustachian tube dysfunction 04/20/2013  . Otitis, externa, infective 04/20/2013  . TMJ (temporomandibular joint disorder) 04/15/2013  . Unspecified vitamin D deficiency 04/15/2013  . Hyperlipidemia 04/15/2013  . Hip pain 03/03/2013  . Depression 03/03/2013  . Bursitis of hip 03/03/2013    Cedra Villalon 09/11/2015, 10:16 AM  [place], [place], [address] Phone: [phone]   Fax:  [phone]  Name: JAIMARIE COSLEY MRN: [id number] Date of Birth: 1951/03/04    [name], PT 09/11/2015 10:16 AM

## 2015-09-14 ENCOUNTER — Ambulatory Visit: Payer: 59 | Admitting: Physical Therapy

## 2015-09-14 DIAGNOSIS — I890 Lymphedema, not elsewhere classified: Secondary | ICD-10-CM

## 2015-09-14 NOTE — Therapy (Signed)
[place], [place], [address] Phone: [phone]   Fax:  [phone]  Physical Therapy Treatment  Patient Details  Name: Holly Key MRN: [id number] Date of Birth: 1951/01/29 Referring Provider: Dr. Dustin Folks  Encounter Date: 09/14/2015      PT End of Session - 09/14/15 0840    Visit Number 18   Number of Visits 26   Date for PT Re-Evaluation 10/02/15   PT Start Time 0808  Pt. late today   PT Stop Time 0838   PT Time Calculation (min) 30 min   Activity Tolerance Patient tolerated treatment well   Behavior During Therapy [facility] for tasks assessed/performed      Past Medical History  Diagnosis Date  . Breast cancer [facility]) 2003    Right Breast   . Atrial premature complex   . Colon polyp   . Depression   . Anxiety   . GERD (gastroesophageal reflux disease)   . Pneumonia     Past Surgical History  Procedure Laterality Date  . Mastectomy Right 2003  . Breast reconstruction Right 2004  . Gastric fundoplication      There were no vitals filed for this visit.  Visit Diagnosis:  Lymphedema of arm      Subjective Assessment - 09/14/15 0815    Subjective The pump arrived Friday--not unpacked yet.  No word on the compression garments yet.  Bandages stayed on till Saturday, then had TG Soft sleeve on, and arm did okay.   Currently in Pain? No/denies                         [facility] Adult PT Treatment/Exercise - 09/14/15 0001    Manual Therapy   Manual Lymphatic Drainage (MLD) In Supine short neck, Lt axilla and Rt inguinal nodes and anterior inter-axillary and Rt axillo-inguinal anastomosis; right UE from fingers to shoulder redirecting along pathways.   Compression Bandaging Bandaging of right UE with TG soft, Artiflex x 1, and 3 short stretch bandages from hand to mid-point of upper arm.  No finger wraps.                   Short Term Clinic Goals -  08/14/15 0949    CC Short Term Goal  #1   Title Patient will be knowledgeable about lymphedema risk reduction.   Status Achieved   CC Short Term Goal  #2   Title Right arm circumference at 10 cm. proximal to olecranon will reduce by 1 cm. to 37.1 cm.  36.5 cm attained on 08/12/15   Status Achieved   CC Short Term Goal  #3   Title Right arm circumference at 10 cm. proximal to ulnar styloid will reduce by 1.5 cm. to 26.7  26.1 cm attained 08/12/15   Status Achieved   CC Short Term Goal  #4   Title Pt. will report pain decrease of at least 50% in right UE  60% improvement thus far 08/14/15   Status Achieved             Long Term Clinic Goals - 09/11/15 1013    CC Long Term Goal  #1   Title Right arm circumference at 10 cm. proximal to olecranon will reduce by 1.5 cm. to 36.5   Status Achieved   CC Long Term Goal  #2   Title Right arm circumference at 10 cm. proximal to ulnar styloid will reduce by 2 cm.  to 26.2   Status Achieved   CC Long Term Goal  #3   Title Patient will be knowledgeable about how and where to obtain daytime and nighttime compression garments   Status Achieved   CC Long Term Goal  #4   Title Pt. will report pain decrease of at least 75% in right UE.   Baseline no pain today   Status Achieved            Plan - 09/14/15 0841    Clinical Impression Statement Patient is pleased with her arm but frustrated that garments haven't come yet, and she hasn't gotten word about them from the vendor.  She plans to call them today.   Pt will benefit from skilled therapeutic intervention in order to improve on the following deficits Increased edema   Rehab Potential Excellent   Clinical Impairments Affecting Rehab Potential previous abdominal surgeries and breast surgeries    PT Frequency 3x / week   PT Duration 6 weeks   PT Treatment/Interventions Manual lymph drainage;Compression bandaging   PT Next Visit Plan complete decongestive therapy   PT Home Exercise Plan  UE remedial lymphedema exercise   Consulted and Agree with Plan of Care Patient        Problem List Patient Active Problem List   Diagnosis Date Noted  . Need for prophylactic vaccination against Streptococcus pneumoniae (pneumococcus) 01/05/2014  . Need for prophylactic vaccination and inoculation against influenza 09/12/2013  . Impacted cerumen of right ear 04/20/2013  . Eustachian tube dysfunction 04/20/2013  . Otitis, externa, infective 04/20/2013  . TMJ (temporomandibular joint disorder) 04/15/2013  . Unspecified vitamin D deficiency 04/15/2013  . Hyperlipidemia 04/15/2013  . Hip pain 03/03/2013  . Depression 03/03/2013  . Bursitis of hip 03/03/2013    Ismael Treptow 09/14/2015, 8:43 AM  [place], [place], [address] Phone: [phone]   Fax:  [phone]  Name: Holly Key MRN: [id number] Date of Birth: [birth year]    [name], PT 09/14/2015 8:43 AM

## 2015-09-16 ENCOUNTER — Ambulatory Visit: Payer: 59 | Admitting: Physical Therapy

## 2015-09-16 DIAGNOSIS — I890 Lymphedema, not elsewhere classified: Secondary | ICD-10-CM | POA: Diagnosis not present

## 2015-09-16 NOTE — Therapy (Addendum)
[place], [place], [address] Phone: [phone]   Fax:  [phone]  Physical Therapy Treatment  Patient Details  Name: Holly Key MRN: [id number] Date of Birth: May 26, 1951 Referring Provider: Dr. Dustin Folks  Encounter Date: 09/16/2015    Past Medical History  Diagnosis Date  . Breast cancer [facility]) 2003    Right Breast   . Atrial premature complex   . Colon polyp   . Depression   . Anxiety   . GERD (gastroesophageal reflux disease)   . Pneumonia     Past Surgical History  Procedure Laterality Date  . Mastectomy Right 2003  . Breast reconstruction Right 2004  . Gastric fundoplication      There were no vitals filed for this visit.  Visit Diagnosis:  Lymphedema of arm                                  Short Term Clinic Goals - 08/14/15 0949    CC Short Term Goal  #1   Title Patient will be knowledgeable about lymphedema risk reduction.   Status Achieved   CC Short Term Goal  #2   Title Right arm circumference at 10 cm. proximal to olecranon will reduce by 1 cm. to 37.1 cm.  36.5 cm attained on 08/12/15   Status Achieved   CC Short Term Goal  #3   Title Right arm circumference at 10 cm. proximal to ulnar styloid will reduce by 1.5 cm. to 26.7  26.1 cm attained 08/12/15   Status Achieved   CC Short Term Goal  #4   Title Pt. will report pain decrease of at least 50% in right UE  60% improvement thus far 08/14/15   Status Achieved             Long Term Clinic Goals - 09/16/15 1318    CC Long Term Goal  #1   Title Right arm circumference at 10 cm. proximal to olecranon will reduce by 1.5 cm. to 36.5   Status Achieved   CC Long Term Goal  #2   Title Right arm circumference at 10 cm. proximal to ulnar styloid will reduce by 2 cm. to 26.2   Status Achieved   CC Long Term Goal  #3   Title Patient will be knowledgeable about how and where to  obtain daytime and nighttime compression garments   Status Achieved   CC Long Term Goal  #4   Title Pt. will report pain decrease of at least 75% in right UE.   Status Achieved   CC Long Term Goal  #5   Title Patient will have obtained her daytime compression garments.   Time 2   Period Weeks   Status Achieved            Problem List Patient Active Problem List   Diagnosis Date Noted  . Need for prophylactic vaccination against Streptococcus pneumoniae (pneumococcus) 01/05/2014  . Need for prophylactic vaccination and inoculation against influenza 09/12/2013  . Impacted cerumen of right ear 04/20/2013  . Eustachian tube dysfunction 04/20/2013  . Otitis, externa, infective 04/20/2013  . TMJ (temporomandibular joint disorder) 04/15/2013  . Unspecified vitamin D deficiency 04/15/2013  . Hyperlipidemia 04/15/2013  . Hip pain 03/03/2013  . Depression 03/03/2013  . Bursitis of hip 03/03/2013    Muaz Shorey 11/04/2015, 4:02 PM  [place]  [place], [place], [address] Phone: [phone]   Fax:  [phone]  Name: Holly Key MRN: [id number] Date of Birth: 1951/07/12    [name], PT 11/04/2015 4:02 PM   Addendum 11/04/15:  Patient did receive her compression garments, both for day- and nighttime and is happy with them.  PHYSICAL THERAPY DISCHARGE SUMMARY  Visits from Start of Care: 26  Current functional level related to goals / functional outcomes: All goals met as noted above   Remaining deficits: Swelling will continue to require management.   Education / Equipment: Self-care for lymphedema; day-and nighttime compression garments. Plan: Patient agrees to discharge.  Patient goals were met. Patient is being discharged due to meeting the stated rehab goals.  ?????    [name], PT 11/04/2015 4:02 PM

## 2015-09-18 ENCOUNTER — Ambulatory Visit: Payer: 59 | Admitting: Physical Therapy

## 2015-09-21 ENCOUNTER — Ambulatory Visit: Payer: 59 | Admitting: Physical Therapy

## 2015-09-23 ENCOUNTER — Ambulatory Visit: Payer: 59

## 2016-03-11 DIAGNOSIS — S8992XA Unspecified injury of left lower leg, initial encounter: Secondary | ICD-10-CM | POA: Diagnosis not present

## 2016-03-11 DIAGNOSIS — M25662 Stiffness of left knee, not elsewhere classified: Secondary | ICD-10-CM | POA: Diagnosis not present

## 2016-03-11 DIAGNOSIS — M25562 Pain in left knee: Secondary | ICD-10-CM | POA: Diagnosis not present

## 2016-03-11 DIAGNOSIS — M7989 Other specified soft tissue disorders: Secondary | ICD-10-CM | POA: Diagnosis not present

## 2016-03-15 DIAGNOSIS — C50919 Malignant neoplasm of unspecified site of unspecified female breast: Secondary | ICD-10-CM | POA: Diagnosis not present

## 2016-03-18 DIAGNOSIS — C50919 Malignant neoplasm of unspecified site of unspecified female breast: Secondary | ICD-10-CM | POA: Diagnosis not present

## 2016-03-31 DIAGNOSIS — M7711 Lateral epicondylitis, right elbow: Secondary | ICD-10-CM | POA: Diagnosis not present

## 2016-06-22 DIAGNOSIS — M25511 Pain in right shoulder: Secondary | ICD-10-CM | POA: Diagnosis not present

## 2016-06-22 DIAGNOSIS — S42251A Displaced fracture of greater tuberosity of right humerus, initial encounter for closed fracture: Secondary | ICD-10-CM | POA: Diagnosis not present

## 2016-06-22 DIAGNOSIS — E785 Hyperlipidemia, unspecified: Secondary | ICD-10-CM | POA: Diagnosis not present

## 2016-06-22 DIAGNOSIS — W19XXXA Unspecified fall, initial encounter: Secondary | ICD-10-CM | POA: Diagnosis not present

## 2016-06-23 DIAGNOSIS — S42254A Nondisplaced fracture of greater tuberosity of right humerus, initial encounter for closed fracture: Secondary | ICD-10-CM | POA: Diagnosis not present

## 2016-07-13 DIAGNOSIS — F418 Other specified anxiety disorders: Secondary | ICD-10-CM | POA: Diagnosis not present

## 2016-07-13 DIAGNOSIS — M25511 Pain in right shoulder: Secondary | ICD-10-CM | POA: Diagnosis not present

## 2016-07-13 DIAGNOSIS — Z9181 History of falling: Secondary | ICD-10-CM | POA: Diagnosis not present

## 2016-07-13 DIAGNOSIS — Z23 Encounter for immunization: Secondary | ICD-10-CM | POA: Diagnosis not present

## 2016-07-13 DIAGNOSIS — E559 Vitamin D deficiency, unspecified: Secondary | ICD-10-CM | POA: Diagnosis not present

## 2016-07-13 DIAGNOSIS — E785 Hyperlipidemia, unspecified: Secondary | ICD-10-CM | POA: Diagnosis not present

## 2016-07-14 DIAGNOSIS — S42254D Nondisplaced fracture of greater tuberosity of right humerus, subsequent encounter for fracture with routine healing: Secondary | ICD-10-CM | POA: Diagnosis not present

## 2016-07-19 DIAGNOSIS — M25511 Pain in right shoulder: Secondary | ICD-10-CM | POA: Diagnosis not present

## 2016-07-21 DIAGNOSIS — M25511 Pain in right shoulder: Secondary | ICD-10-CM | POA: Diagnosis not present

## 2016-07-29 DIAGNOSIS — M6281 Muscle weakness (generalized): Secondary | ICD-10-CM | POA: Diagnosis not present

## 2016-07-29 DIAGNOSIS — M25611 Stiffness of right shoulder, not elsewhere classified: Secondary | ICD-10-CM | POA: Diagnosis not present

## 2016-07-29 DIAGNOSIS — M25511 Pain in right shoulder: Secondary | ICD-10-CM | POA: Diagnosis not present

## 2016-08-05 DIAGNOSIS — M25511 Pain in right shoulder: Secondary | ICD-10-CM | POA: Diagnosis not present

## 2016-08-11 DIAGNOSIS — S42254D Nondisplaced fracture of greater tuberosity of right humerus, subsequent encounter for fracture with routine healing: Secondary | ICD-10-CM | POA: Diagnosis not present

## 2016-08-19 DIAGNOSIS — M6281 Muscle weakness (generalized): Secondary | ICD-10-CM | POA: Diagnosis not present

## 2016-08-19 DIAGNOSIS — M25611 Stiffness of right shoulder, not elsewhere classified: Secondary | ICD-10-CM | POA: Diagnosis not present

## 2016-08-19 DIAGNOSIS — M25511 Pain in right shoulder: Secondary | ICD-10-CM | POA: Diagnosis not present

## 2016-08-25 DIAGNOSIS — R1030 Lower abdominal pain, unspecified: Secondary | ICD-10-CM | POA: Diagnosis not present

## 2016-08-25 DIAGNOSIS — R300 Dysuria: Secondary | ICD-10-CM | POA: Diagnosis not present

## 2016-08-25 DIAGNOSIS — M25611 Stiffness of right shoulder, not elsewhere classified: Secondary | ICD-10-CM | POA: Diagnosis not present

## 2016-08-25 DIAGNOSIS — B373 Candidiasis of vulva and vagina: Secondary | ICD-10-CM | POA: Diagnosis not present

## 2016-08-25 DIAGNOSIS — M6281 Muscle weakness (generalized): Secondary | ICD-10-CM | POA: Diagnosis not present

## 2016-08-25 DIAGNOSIS — M25511 Pain in right shoulder: Secondary | ICD-10-CM | POA: Diagnosis not present

## 2016-09-09 DIAGNOSIS — M25511 Pain in right shoulder: Secondary | ICD-10-CM | POA: Diagnosis not present

## 2016-09-09 DIAGNOSIS — M6281 Muscle weakness (generalized): Secondary | ICD-10-CM | POA: Diagnosis not present

## 2016-09-09 DIAGNOSIS — M25611 Stiffness of right shoulder, not elsewhere classified: Secondary | ICD-10-CM | POA: Diagnosis not present

## 2016-09-12 DIAGNOSIS — C50919 Malignant neoplasm of unspecified site of unspecified female breast: Secondary | ICD-10-CM | POA: Diagnosis not present

## 2016-09-14 DIAGNOSIS — C50811 Malignant neoplasm of overlapping sites of right female breast: Secondary | ICD-10-CM | POA: Diagnosis not present

## 2016-09-14 DIAGNOSIS — Z170 Estrogen receptor positive status [ER+]: Secondary | ICD-10-CM | POA: Diagnosis not present

## 2016-10-17 DIAGNOSIS — M7711 Lateral epicondylitis, right elbow: Secondary | ICD-10-CM | POA: Diagnosis not present

## 2016-11-08 DIAGNOSIS — Z803 Family history of malignant neoplasm of breast: Secondary | ICD-10-CM | POA: Diagnosis not present

## 2016-11-08 DIAGNOSIS — Z1231 Encounter for screening mammogram for malignant neoplasm of breast: Secondary | ICD-10-CM | POA: Diagnosis not present

## 2016-11-18 DIAGNOSIS — R05 Cough: Secondary | ICD-10-CM | POA: Diagnosis not present

## 2016-11-18 DIAGNOSIS — R509 Fever, unspecified: Secondary | ICD-10-CM | POA: Diagnosis not present

## 2016-11-18 DIAGNOSIS — R6889 Other general symptoms and signs: Secondary | ICD-10-CM | POA: Diagnosis not present

## 2016-12-13 DIAGNOSIS — F418 Other specified anxiety disorders: Secondary | ICD-10-CM | POA: Diagnosis not present

## 2016-12-13 DIAGNOSIS — J40 Bronchitis, not specified as acute or chronic: Secondary | ICD-10-CM | POA: Diagnosis not present

## 2016-12-13 DIAGNOSIS — R05 Cough: Secondary | ICD-10-CM | POA: Diagnosis not present

## 2016-12-24 DIAGNOSIS — Z23 Encounter for immunization: Secondary | ICD-10-CM | POA: Diagnosis not present

## 2016-12-24 DIAGNOSIS — S61211A Laceration without foreign body of left index finger without damage to nail, initial encounter: Secondary | ICD-10-CM | POA: Diagnosis not present

## 2016-12-31 DIAGNOSIS — Z4802 Encounter for removal of sutures: Secondary | ICD-10-CM | POA: Diagnosis not present

## 2017-01-09 DIAGNOSIS — S93602A Unspecified sprain of left foot, initial encounter: Secondary | ICD-10-CM | POA: Diagnosis not present

## 2017-01-09 DIAGNOSIS — M898X7 Other specified disorders of bone, ankle and foot: Secondary | ICD-10-CM | POA: Diagnosis not present

## 2017-01-09 DIAGNOSIS — S99922A Unspecified injury of left foot, initial encounter: Secondary | ICD-10-CM | POA: Diagnosis not present

## 2017-01-09 DIAGNOSIS — M79672 Pain in left foot: Secondary | ICD-10-CM | POA: Diagnosis not present

## 2017-01-11 DIAGNOSIS — Z0000 Encounter for general adult medical examination without abnormal findings: Secondary | ICD-10-CM | POA: Diagnosis not present

## 2017-01-11 DIAGNOSIS — Z780 Asymptomatic menopausal state: Secondary | ICD-10-CM | POA: Diagnosis not present

## 2017-01-11 DIAGNOSIS — Z23 Encounter for immunization: Secondary | ICD-10-CM | POA: Diagnosis not present

## 2017-01-13 DIAGNOSIS — M79672 Pain in left foot: Secondary | ICD-10-CM | POA: Diagnosis not present

## 2017-01-13 DIAGNOSIS — S9032XA Contusion of left foot, initial encounter: Secondary | ICD-10-CM | POA: Diagnosis not present

## 2017-01-17 DIAGNOSIS — Z780 Asymptomatic menopausal state: Secondary | ICD-10-CM | POA: Diagnosis not present

## 2017-02-07 DIAGNOSIS — N950 Postmenopausal bleeding: Secondary | ICD-10-CM | POA: Diagnosis not present

## 2017-02-07 DIAGNOSIS — Z853 Personal history of malignant neoplasm of breast: Secondary | ICD-10-CM | POA: Diagnosis not present

## 2017-02-28 DIAGNOSIS — N857 Hematometra: Secondary | ICD-10-CM | POA: Diagnosis not present

## 2017-02-28 DIAGNOSIS — N950 Postmenopausal bleeding: Secondary | ICD-10-CM | POA: Diagnosis not present

## 2017-02-28 DIAGNOSIS — Z01818 Encounter for other preprocedural examination: Secondary | ICD-10-CM | POA: Diagnosis not present

## 2017-03-10 DIAGNOSIS — N840 Polyp of corpus uteri: Secondary | ICD-10-CM | POA: Diagnosis not present

## 2017-03-10 DIAGNOSIS — N857 Hematometra: Secondary | ICD-10-CM | POA: Diagnosis not present

## 2017-03-10 DIAGNOSIS — N950 Postmenopausal bleeding: Secondary | ICD-10-CM | POA: Diagnosis not present

## 2017-03-10 DIAGNOSIS — D261 Other benign neoplasm of corpus uteri: Secondary | ICD-10-CM | POA: Diagnosis not present

## 2017-03-20 DIAGNOSIS — T814XXA Infection following a procedure, initial encounter: Secondary | ICD-10-CM | POA: Diagnosis not present

## 2017-03-20 DIAGNOSIS — N710 Acute inflammatory disease of uterus: Secondary | ICD-10-CM | POA: Diagnosis not present

## 2017-03-20 DIAGNOSIS — R300 Dysuria: Secondary | ICD-10-CM | POA: Diagnosis not present

## 2017-03-27 DIAGNOSIS — N710 Acute inflammatory disease of uterus: Secondary | ICD-10-CM | POA: Diagnosis not present

## 2017-05-29 DIAGNOSIS — Z86010 Personal history of colonic polyps: Secondary | ICD-10-CM | POA: Diagnosis not present

## 2017-06-15 DIAGNOSIS — Z86010 Personal history of colonic polyps: Secondary | ICD-10-CM | POA: Diagnosis not present

## 2017-06-15 DIAGNOSIS — K641 Second degree hemorrhoids: Secondary | ICD-10-CM | POA: Diagnosis not present

## 2017-06-15 DIAGNOSIS — D122 Benign neoplasm of ascending colon: Secondary | ICD-10-CM | POA: Diagnosis not present

## 2017-06-15 DIAGNOSIS — D125 Benign neoplasm of sigmoid colon: Secondary | ICD-10-CM | POA: Diagnosis not present

## 2017-06-15 DIAGNOSIS — K5730 Diverticulosis of large intestine without perforation or abscess without bleeding: Secondary | ICD-10-CM | POA: Diagnosis not present

## 2017-06-28 DIAGNOSIS — F419 Anxiety disorder, unspecified: Secondary | ICD-10-CM | POA: Diagnosis not present

## 2017-06-28 DIAGNOSIS — F329 Major depressive disorder, single episode, unspecified: Secondary | ICD-10-CM | POA: Diagnosis not present

## 2017-06-28 DIAGNOSIS — M79601 Pain in right arm: Secondary | ICD-10-CM | POA: Diagnosis not present

## 2017-06-28 DIAGNOSIS — Z23 Encounter for immunization: Secondary | ICD-10-CM | POA: Diagnosis not present

## 2017-07-11 DIAGNOSIS — Z23 Encounter for immunization: Secondary | ICD-10-CM | POA: Diagnosis not present

## 2017-07-11 DIAGNOSIS — S91342A Puncture wound with foreign body, left foot, initial encounter: Secondary | ICD-10-CM | POA: Diagnosis not present

## 2017-07-24 DIAGNOSIS — H16142 Punctate keratitis, left eye: Secondary | ICD-10-CM | POA: Diagnosis not present

## 2017-07-24 DIAGNOSIS — T1512XA Foreign body in conjunctival sac, left eye, initial encounter: Secondary | ICD-10-CM | POA: Diagnosis not present

## 2017-07-26 DIAGNOSIS — H5712 Ocular pain, left eye: Secondary | ICD-10-CM | POA: Diagnosis not present

## 2017-07-26 DIAGNOSIS — S0502XD Injury of conjunctiva and corneal abrasion without foreign body, left eye, subsequent encounter: Secondary | ICD-10-CM | POA: Diagnosis not present

## 2017-08-09 DIAGNOSIS — Z08 Encounter for follow-up examination after completed treatment for malignant neoplasm: Secondary | ICD-10-CM | POA: Diagnosis not present

## 2017-08-09 DIAGNOSIS — D1801 Hemangioma of skin and subcutaneous tissue: Secondary | ICD-10-CM | POA: Diagnosis not present

## 2017-08-09 DIAGNOSIS — Z85828 Personal history of other malignant neoplasm of skin: Secondary | ICD-10-CM | POA: Diagnosis not present

## 2017-09-09 DIAGNOSIS — J40 Bronchitis, not specified as acute or chronic: Secondary | ICD-10-CM | POA: Diagnosis not present

## 2017-09-14 DIAGNOSIS — Z853 Personal history of malignant neoplasm of breast: Secondary | ICD-10-CM | POA: Diagnosis not present

## 2017-10-31 DIAGNOSIS — Z9221 Personal history of antineoplastic chemotherapy: Secondary | ICD-10-CM | POA: Diagnosis not present

## 2017-10-31 DIAGNOSIS — Z853 Personal history of malignant neoplasm of breast: Secondary | ICD-10-CM | POA: Diagnosis not present

## 2017-10-31 DIAGNOSIS — D7589 Other specified diseases of blood and blood-forming organs: Secondary | ICD-10-CM | POA: Diagnosis not present

## 2017-11-09 DIAGNOSIS — Z1231 Encounter for screening mammogram for malignant neoplasm of breast: Secondary | ICD-10-CM | POA: Diagnosis not present

## 2017-11-09 DIAGNOSIS — Z853 Personal history of malignant neoplasm of breast: Secondary | ICD-10-CM | POA: Diagnosis not present

## 2018-01-09 DIAGNOSIS — F419 Anxiety disorder, unspecified: Secondary | ICD-10-CM | POA: Diagnosis not present

## 2018-01-09 DIAGNOSIS — R5383 Other fatigue: Secondary | ICD-10-CM | POA: Diagnosis not present

## 2018-01-09 DIAGNOSIS — E785 Hyperlipidemia, unspecified: Secondary | ICD-10-CM | POA: Diagnosis not present

## 2018-01-09 DIAGNOSIS — E559 Vitamin D deficiency, unspecified: Secondary | ICD-10-CM | POA: Diagnosis not present

## 2018-01-14 DIAGNOSIS — R079 Chest pain, unspecified: Secondary | ICD-10-CM | POA: Diagnosis not present

## 2018-01-15 DIAGNOSIS — R143 Flatulence: Secondary | ICD-10-CM | POA: Diagnosis not present

## 2018-01-15 DIAGNOSIS — R079 Chest pain, unspecified: Secondary | ICD-10-CM | POA: Diagnosis not present

## 2018-01-23 DIAGNOSIS — I10 Essential (primary) hypertension: Secondary | ICD-10-CM | POA: Diagnosis not present

## 2018-01-23 DIAGNOSIS — E559 Vitamin D deficiency, unspecified: Secondary | ICD-10-CM | POA: Diagnosis not present

## 2018-01-23 DIAGNOSIS — R5383 Other fatigue: Secondary | ICD-10-CM | POA: Diagnosis not present

## 2018-01-23 DIAGNOSIS — E785 Hyperlipidemia, unspecified: Secondary | ICD-10-CM | POA: Diagnosis not present

## 2018-03-09 ENCOUNTER — Ambulatory Visit: Payer: PPO | Attending: Hematology and Oncology | Admitting: Physical Therapy

## 2018-03-09 ENCOUNTER — Encounter: Payer: Self-pay | Admitting: Physical Therapy

## 2018-03-09 ENCOUNTER — Other Ambulatory Visit: Payer: Self-pay

## 2018-03-09 DIAGNOSIS — M25611 Stiffness of right shoulder, not elsewhere classified: Secondary | ICD-10-CM | POA: Diagnosis not present

## 2018-03-09 DIAGNOSIS — M6281 Muscle weakness (generalized): Secondary | ICD-10-CM | POA: Diagnosis not present

## 2018-03-09 DIAGNOSIS — M79621 Pain in right upper arm: Secondary | ICD-10-CM | POA: Diagnosis not present

## 2018-03-09 DIAGNOSIS — I972 Postmastectomy lymphedema syndrome: Secondary | ICD-10-CM | POA: Insufficient documentation

## 2018-03-09 NOTE — Therapy (Signed)
[place], [place], [address] Phone: [phone]   Fax:  [phone]  Physical Therapy Evaluation  Patient Details  Name: Holly Key MRN: [id number] Date of Birth: 06/28/1951 Referring Provider: Wendee Beavers   Encounter Date: 03/09/2018  PT End of Session - 03/09/18 1231    Visit Number  1    Number of Visits  13    Date for PT Re-Evaluation  04/06/18    PT Start Time  0850    PT Stop Time  0930    PT Time Calculation (min)  40 min    Activity Tolerance  Patient tolerated treatment well    Behavior During Therapy  [facility] for tasks assessed/performed       Past Medical History:  Diagnosis Date  . Anxiety   . Atrial premature complex   . Breast cancer [facility]) 2003   Right Breast   . Colon polyp   . Depression   . GERD (gastroesophageal reflux disease)   . Pneumonia     Past Surgical History:  Procedure Laterality Date  . BREAST RECONSTRUCTION Right 2004  . GASTRIC FUNDOPLICATION    . MASTECTOMY Right 2003    There were no vitals filed for this visit.   Subjective Assessment - 03/09/18 0851    Subjective  My right arm swelling and pain has gotten much worse since I fell and broke my right shoulder. I fell in Sept 2017. I was trying to keep up with the lymphatic massage, pump and sleeve but then the pain started coming. The pain I have hits like lightning. I think it is nerve pain. I went to physical therapy for a while. Physical therapy took care of that pain but now when I am asleep and the fluid comes back under my arm it collects in my posterior armpit and back and causes a lot of pain. I use the FlexiTouch every day.     Pertinent History  2003 right mastectomy 5 sentinel nodes removed; then 6 rounds of chemo, no radiation.  Had been doing fine but a year and a half ago, went bowling.  After that had pain in right elbow and shoulder, then axilla, and then swelling that started proximal and  moved down into the hand.  Both pain and swelling have worsened.  HHad breast reconstruction in 2004 with implant.  Had two basal cell skin cancers on chin and neck this year.  Blood pressure elevates to around 140 when she is in pain.  Two years ago had pneumonia and was hospitalized for 5 days.      Patient Stated Goals  relief of pain and swelling    Currently in Pain?  Yes    Pain Score  4     Pain Location  Arm    Pain Orientation  Upper;Posterior    Pain Descriptors / Indicators  Heaviness;Sharp    Pain Type  Chronic pain    Pain Onset  More than a month ago    Pain Frequency  Intermittent    Aggravating Factors   eating salt, certain types of exercise- reaching down to floor to pick something up,     Pain Relieving Factors  elevation, self MLD    Effect of Pain on Daily Activities  hard to pick up things on floor, hard to reach overhead         [facility] PT Assessment - 03/09/18 0001      Assessment   Medical Diagnosis  right breast cancer with mastectomy    Referring Provider  Chinnasami    Onset Date/Surgical Date  12/08/01 approx.    Hand Dominance  Right    Prior Therapy  lymphedema therapy at this clinic in 2013, pt for broken shoulder 2017      Precautions   Precautions  Other (comment) reaction to lidocaine    Precaution Comments  lymphedema, previously broken right shoulder      Restrictions   Weight Bearing Restrictions  No      Balance Screen   Has the patient fallen in the past 6 months  No    Has the patient had a decrease in activity level because of a fear of falling?   No    Is the patient reluctant to leave their home because of a fear of falling?   No      Home Film/video editor residence    Living Arrangements  Spouse/significant other    Available Help at Discharge  Family    Type of [place] Access  Level entry    Centre  One level      Prior Function   Level of [place]   Retired    Biomedical scientist  --    Leisure  no regular exercise      Cognition   Overall Cognitive Status  Within Functional Limits for tasks assessed      Observation/Other Assessments   Observations  fullness of RUE with some erythema    Other Surveys   -- lymphedema life impact scale = 27 = 40% impairment      Posture/Postural Control   Posture/Postural Control  Postural limitations    Postural Limitations  Rounded Shoulders;Forward head slight      AROM   Overall AROM Comments  --    AROM Assessment Site  Shoulder    Right/Left Shoulder  Right;Left    Right Shoulder Flexion  149 Degrees    Right Shoulder ABduction  137 Degrees    Right Shoulder Internal Rotation  65 Degrees    Right Shoulder External Rotation  83 Degrees    Left Shoulder Flexion  160 Degrees    Left Shoulder ABduction  165 Degrees    Left Shoulder Internal Rotation  69 Degrees    Left Shoulder External Rotation  89 Degrees      Strength   Overall Strength Comments  --      Palpation   Palpation comment  --        LYMPHEDEMA/ONCOLOGY QUESTIONNAIRE - 03/09/18 0915      Type   Cancer Type  right breast       Surgeries   Mastectomy Date  12/08/01 approx.    Saline Implant Reconstruction Date  03/11/03 approx.      Date Lymphedema/Swelling Started   Date  01/08/14 approx.      Treatment   Past Chemotherapy Treatment  Yes    Date  08/31/02 approx.    Past Radiation Treatment  No      What other symptoms do you have   Are you Having Heaviness or Tightness  Yes    Are you having Pain  Yes    Are you having pitting edema  Yes    Body Site  right arm    Stemmer Sign  No      Lymphedema Assessments   Lymphedema Assessments  Upper extremities      Right Upper Extremity Lymphedema   15 cm Proximal to Olecranon Process  45.5 cm    10 cm Proximal to Olecranon Process  --    Olecranon Process  31.5 cm    15 cm Proximal to Ulnar Styloid Process  31.1 cm    10 cm Proximal to Ulnar Styloid  Process  --    Just Proximal to Ulnar Styloid Process  17.5 cm    Across Hand at [employer]  18.6 cm    At [place] of 2nd Digit  5.9 cm      Left Upper Extremity Lymphedema   15 cm Proximal to Olecranon Process  37 cm    10 cm Proximal to Olecranon Process  --    Olecranon Process  27.2 cm    15 cm Proximal to Ulnar Styloid Process  26 cm    10 cm Proximal to Ulnar Styloid Process  --    Just Proximal to Ulnar Styloid Process  16 cm    Across Hand at [employer]  18.3 cm    At [place] of 2nd Digit  6.1 cm             Outpatient Rehab from [facility]/09/[facility]  Lymphedema Life Impact Scale Total Score  30.88 %      Objective measurements completed on examination: See above findings.              PT Education - 03/09/18 1239    Education provided  Yes    Education Details  circular vs. flat knit and 20-3mmHg vs 30-59mmHg and how this changes with type of garment, Tribute night time wrap    Person(s) Educated  Patient    Methods  Explanation;Handout    Comprehension  Verbalized understanding        Short Term Clinic Goals - 08/14/15 0949      CC Short Term Goal  #1   Title  Patient will be knowledgeable about lymphedema risk reduction.    Status  Achieved      CC Short Term Goal  #2   Title  Right arm circumference at 10 cm. proximal to olecranon will reduce by 1 cm. to 37.1 cm. 36.5 cm attained on 08/12/15    Status  Achieved      CC Short Term Goal  #3   Title  Right arm circumference at 10 cm. proximal to ulnar styloid will reduce by 1.5 cm. to 26.7 26.1 cm attained 08/12/15    Status  Achieved      CC Short Term Goal  #4   Title  Pt. will report pain decrease of at least 50% in right UE 60% improvement thus far 08/14/15    Status  Achieved       PT Long Term Goals - 03/09/18 1236      PT LONG TERM GOAL #1   Title  Pt will be independent in self manual lymphatic drainage for long term management of  lymphedema    Time  4    Period  Weeks    Status  New    Target Date  04/06/18      PT LONG TERM GOAL #2   Title  Pt will demonstrate a 5 cm decrease in lymphedema at 15 cm proximal to olecranon to decrease risk of cellulitis.     Baseline  45.5    Time  4  Period  Weeks    Status  New    Target Date  04/06/18      PT LONG TERM GOAL #3   Title  Pt will demonstrate 160 degrees of right shoulder flexion to allow her to reach items overhead    Baseline  149    Time  4    Period  Weeks    Status  New    Target Date  04/06/18      PT LONG TERM GOAL #4   Title  Pt will demonstrate 160 degrees of right shoulder abduction to allow her to reach out to sides    Baseline  137    Time  4    Period  Weeks    Status  New    Target Date  04/06/18      PT LONG TERM GOAL #5   Title  Pt will receive appropriate compression garments for long term management of lymphedema (flat knit 20-30 sleeve and glove, Tribute night, circular knit 30-40 sleeve and glove)    Time  4    Period  Weeks    Status  New    Target Date  04/06/18        Long Term Clinic Goals - 09/16/15 1318      CC Long Term Goal  #1   Title  Right arm circumference at 10 cm. proximal to olecranon will reduce by 1.5 cm. to 36.5    Status  Achieved      CC Long Term Goal  #2   Title  Right arm circumference at 10 cm. proximal to ulnar styloid will reduce by 2 cm. to 26.2    Status  Achieved      CC Long Term Goal  #3   Title  Patient will be knowledgeable about how and where to obtain daytime and nighttime compression garments    Status  Achieved      CC Long Term Goal  #4   Title  Pt. will report pain decrease of at least 75% in right UE.    Status  Achieved      CC Long Term Goal  #5   Title  Patient will have obtained her daytime compression garments.    Time  2    Period  Weeks    Status  Achieved          Plan - 03/09/18 1231    Clinical Impression Statement  Pt previously known to this clinic in  2016 for R UE lymphedema following treatment for right breast cancer. Pt now presents with worsening RUE lymphedema. She reports she fell in 2017 on her right shoulder and broke it and she fell with her arm outstretched and ever since then has had increased swelling. She has been trying to manage her swelling through use of her compression sleeve (circular knit 20-30), uses compression pump daily and performs self drainage. She reports the self drainage helps a lot but would like a "refresher course" to make sure she is doing it correctly. Pt would benefit from skilled PT services to assist pt with obtaining an appropriate compression sleeve for day and night time since the 20-30 circular knit is not containing her well, improve R shoulder ROM, decrease R shoulder discomfort, and improve strength.     History and Personal Factors relevant to plan of care:  right handed, previous hx of broken shoulder on R    Clinical Presentation  Evolving    Clinical  Presentation due to:  recent worsening of swelling    Clinical Decision Making  Moderate    Rehab Potential  Excellent    Clinical Impairments Affecting Rehab Potential  previous abdominal surgeries and breast surgeries     PT Frequency  3x / week    PT Duration  4 weeks    PT Treatment/Interventions  Manual lymph drainage;Compression bandaging;ADLs/Self Care Home Management;Therapeutic exercise;Therapeutic activities;Patient/family education;Orthotic Fit/Training;Manual techniques;Scar mobilization;Vasopneumatic Device;Taping;Passive range of motion    PT Next Visit Plan  complete decongestive therapy including bandaging to RUE, once pt is maximally reduced she would benefit from a 20-30 flat knit sleeve and glove, 30-40 circular knit sleeve and glove and tribute night wrap     Consulted and Agree with Plan of Care  Patient       Patient will benefit from skilled therapeutic intervention in order to improve the following deficits and impairments:   Increased edema, Decreased knowledge of precautions, Decreased range of motion, Decreased strength, Impaired UE functional use, Postural dysfunction, Decreased scar mobility, Pain  Visit Diagnosis: Postmastectomy lymphedema - Plan: PT plan of care cert/re-cert  Stiffness of right shoulder, not elsewhere classified - Plan: PT plan of care cert/re-cert  Pain in right upper arm - Plan: PT plan of care cert/re-cert  Muscle weakness (generalized) - Plan: PT plan of care cert/re-cert     Problem List Patient Active Problem List   Diagnosis Date Noted  . Need for prophylactic vaccination against Streptococcus pneumoniae (pneumococcus) 01/05/2014  . Need for prophylactic vaccination and inoculation against influenza 09/12/2013  . Impacted cerumen of right ear 04/20/2013  . Eustachian tube dysfunction 04/20/2013  . Otitis, externa, infective 04/20/2013  . TMJ (temporomandibular joint disorder) 04/15/2013  . Unspecified vitamin D deficiency 04/15/2013  . Hyperlipidemia 04/15/2013  . Hip pain 03/03/2013  . Depression 03/03/2013  . Bursitis of hip 03/03/2013    Allyson Sabal [facility] 03/09/2018, 12:42 PM  [place], [place], [address] Phone: [phone]   Fax:  [phone]  Name: NIMISHA RATHEL MRN: [id number] Date of Birth: 20-Mar-1951  Manus Gunning, PT 03/09/18 12:42 PM

## 2018-03-16 ENCOUNTER — Encounter

## 2018-03-19 ENCOUNTER — Ambulatory Visit: Payer: PPO | Attending: Hematology and Oncology | Admitting: Physical Therapy

## 2018-03-19 ENCOUNTER — Encounter: Payer: Self-pay | Admitting: Physical Therapy

## 2018-03-19 DIAGNOSIS — I972 Postmastectomy lymphedema syndrome: Secondary | ICD-10-CM | POA: Diagnosis not present

## 2018-03-19 DIAGNOSIS — M25611 Stiffness of right shoulder, not elsewhere classified: Secondary | ICD-10-CM | POA: Insufficient documentation

## 2018-03-19 DIAGNOSIS — M79621 Pain in right upper arm: Secondary | ICD-10-CM | POA: Diagnosis not present

## 2018-03-19 DIAGNOSIS — M6281 Muscle weakness (generalized): Secondary | ICD-10-CM | POA: Insufficient documentation

## 2018-03-19 NOTE — Therapy (Signed)
[place], [place], [address] Phone: [phone]   Fax:  [phone]  Physical Therapy Treatment  Patient Details  Name: Holly Key MRN: [id number] Date of Birth: 10/04/1951 Referring Provider: Wendee Beavers   Encounter Date: 03/19/2018  PT End of Session - 03/19/18 1204    Visit Number  2    Number of Visits  13    Date for PT Re-Evaluation  04/06/18    PT Start Time  0935    PT Stop Time  1016    PT Time Calculation (min)  41 min    Activity Tolerance  Patient tolerated treatment well    Behavior During Therapy  [facility] for tasks assessed/performed       Past Medical History:  Diagnosis Date  . Anxiety   . Atrial premature complex   . Breast cancer [facility]) 2003   Right Breast   . Colon polyp   . Depression   . GERD (gastroesophageal reflux disease)   . Pneumonia     Past Surgical History:  Procedure Laterality Date  . BREAST RECONSTRUCTION Right 2004  . GASTRIC FUNDOPLICATION    . MASTECTOMY Right 2003    There were no vitals filed for this visit.  Subjective Assessment - 03/19/18 0936    Subjective  This morning my arm is hurting up under the upper arm. Usually the MLD relieves it.     Pertinent History  2003 right mastectomy 5 sentinel nodes removed; then 6 rounds of chemo, no radiation.  Had been doing fine but a year and a half ago, went bowling.  After that had pain in right elbow and shoulder, then axilla, and then swelling that started proximal and moved down into the hand.  Both pain and swelling have worsened.  HHad breast reconstruction in 2004 with implant.  Had two basal cell skin cancers on chin and neck this year.  Blood pressure elevates to around 140 when she is in pain.  Two years ago had pneumonia and was hospitalized for 5 days.      Patient Stated Goals  relief of pain and swelling    Currently in Pain?  Yes    Pain Score  4     Pain Location  Arm    Pain Orientation   Upper;Posterior                  Outpatient Re[facility]/09/[facility]  Lymphedema Life Impact Scale Total Score  30.88 %           [facility] Adult PT Treatment/Exercise - 03/19/18 0001      Manual Therapy   Manual Therapy  Manual Lymphatic Drainage (MLD);Compression Bandaging    Manual Lymphatic Drainage (MLD)  instructed pt verbally throughout and issued handout: short neck, superficial and deep abdominals, left axillary nodes and establishment of interaxillary pathway, right inguinal nodes and establishment of right axillo inguinal pathway, RUE working proximal to distal then retracing all steps then to left sidelying to work on posterior back lymphedema then back to supine finishing with pathways    Compression Bandaging  lotion applied, thick stockinette, artiflex from hand to axilla, 1 6cm, 1 8cm, 1 10cm and 1 12 cm from hand to axilla                  PT Long Term Goals - 03/09/18 1236      PT LONG TERM GOAL #1   Title  Pt will be independent in self manual lymphatic drainage for long term management of lymphedema    Time  4    Period  Weeks    Status  New    Target Date  04/06/18      PT LONG TERM GOAL #2   Title  Pt will demonstrate a 5 cm decrease in lymphedema at 15 cm proximal to olecranon to decrease risk of cellulitis.     Baseline  45.5    Time  4    Period  Weeks    Status  New    Target Date  04/06/18      PT LONG TERM GOAL #3   Title  Pt will demonstrate 160 degrees of right shoulder flexion to allow her to reach items overhead    Baseline  149    Time  4    Period  Weeks    Status  New    Target Date  04/06/18      PT LONG TERM GOAL #4   Title  Pt will demonstrate 160 degrees of right shoulder abduction to allow her to reach out to sides    Baseline  137    Time  4    Period  Weeks    Status  New    Target Date  04/06/18      PT LONG TERM GOAL #5   Title  Pt will receive appropriate  compression garments for long term management of lymphedema (flat knit 20-30 sleeve and glove, Tribute night, circular knit 30-40 sleeve and glove)    Time  4    Period  Weeks    Status  New    Target Date  04/06/18            Plan - 03/19/18 1205    Clinical Impression Statement  Began complete decongestive therapy today to RUE. Re instructed pt throughout MLD in proper technique and issued handout on this. Applied bandages from hand to axilla and did not wrap fingers because pt stated that her fingers do not normally swell.     Rehab Potential  Excellent    Clinical Impairments Affecting Rehab Potential  previous abdominal surgeries and breast surgeries     PT Frequency  3x / week    PT Duration  4 weeks    PT Treatment/Interventions  Manual lymph drainage;Compression bandaging;ADLs/Self Care Home Management;Therapeutic exercise;Therapeutic activities;Patient/family education;Orthotic Fit/Training;Manual techniques;Scar mobilization;Vasopneumatic Device;Taping;Passive range of motion    PT Next Visit Plan  complete decongestive therapy including bandaging to RUE, once pt is maximally reduced she would benefit from a 20-30 flat knit sleeve and glove, 30-40 circular knit sleeve and glove and tribute night wrap     PT Home Exercise Plan  self MLD    Consulted and Agree with Plan of Care  Patient       Patient will benefit from skilled therapeutic intervention in order to improve the following deficits and impairments:  Increased edema, Decreased knowledge of precautions, Decreased range of motion, Decreased strength, Impaired UE functional use, Postural dysfunction, Decreased scar mobility, Pain  Visit Diagnosis: Postmastectomy lymphedema     Problem List Patient Active Problem List   Diagnosis Date Noted  . Need for prophylactic vaccination against Streptococcus pneumoniae (pneumococcus) 01/05/2014  . Need for prophylactic vaccination and inoculation against influenza 09/12/2013   . Impacted cerumen of right ear 04/20/2013  . Eustachian tube dysfunction 04/20/2013  . Otitis, externa, infective 04/20/2013  . TMJ (temporomandibular joint disorder)  04/15/2013  . Unspecified vitamin D deficiency 04/15/2013  . Hyperlipidemia 04/15/2013  . Hip pain 03/03/2013  . Depression 03/03/2013  . Bursitis of hip 03/03/2013    Allyson Sabal [facility] 03/19/2018, 12:07 PM  [place], [place], [address] Phone: [phone]   Fax:  [phone]  Name: Holly Key MRN: [id number] Date of Birth: [birth year]  Manus Gunning, PT 03/19/18 12:07 PM

## 2018-03-19 NOTE — Patient Instructions (Signed)

## 2018-03-21 ENCOUNTER — Ambulatory Visit: Payer: PPO | Admitting: Physical Therapy

## 2018-03-21 ENCOUNTER — Encounter: Payer: Self-pay | Admitting: Physical Therapy

## 2018-03-21 DIAGNOSIS — I972 Postmastectomy lymphedema syndrome: Secondary | ICD-10-CM

## 2018-03-21 DIAGNOSIS — M79621 Pain in right upper arm: Secondary | ICD-10-CM

## 2018-03-21 DIAGNOSIS — M25611 Stiffness of right shoulder, not elsewhere classified: Secondary | ICD-10-CM

## 2018-03-21 NOTE — Therapy (Signed)
[place], [place], [address] Phone: [phone]   Fax:  [phone]  Physical Therapy Treatment  Patient Details  Name: Holly Key MRN: [id number] Date of Birth: 04/05/1951 Referring Provider: Wendee Beavers   Encounter Date: 03/21/2018  PT End of Session - 03/21/18 1147    Visit Number  3    Number of Visits  13    Date for PT Re-Evaluation  04/06/18    PT Start Time  1105    PT Stop Time  1145    PT Time Calculation (min)  40 min    Activity Tolerance  Patient tolerated treatment well    Behavior During Therapy  [facility] for tasks assessed/performed       Past Medical History:  Diagnosis Date  . Anxiety   . Atrial premature complex   . Breast cancer [facility]) 2003   Right Breast   . Colon polyp   . Depression   . GERD (gastroesophageal reflux disease)   . Pneumonia     Past Surgical History:  Procedure Laterality Date  . BREAST RECONSTRUCTION Right 2004  . GASTRIC FUNDOPLICATION    . MASTECTOMY Right 2003    There were no vitals filed for this visit.  Subjective Assessment - 03/21/18 1147    Subjective  The bandages stayed on my arm. They were a little tight at first but then they loosened up.     Pertinent History  2003 right mastectomy 5 sentinel nodes removed; then 6 rounds of chemo, no radiation.  Had been doing fine but a year and a half ago, went bowling.  After that had pain in right elbow and shoulder, then axilla, and then swelling that started proximal and moved down into the hand.  Both pain and swelling have worsened.  HHad breast reconstruction in 2004 with implant.  Had two basal cell skin cancers on chin and neck this year.  Blood pressure elevates to around 140 when she is in pain.  Two years ago had pneumonia and was hospitalized for 5 days.      Patient Stated Goals  relief of pain and swelling    Currently in Pain?  No/denies    Pain Score  0-No pain                   Outpatient Re[facility] from [facility]/09/[facility]  Lymphedema Life Impact Scale Total Score  30.88 %           [facility] Adult PT Treatment/Exercise - 03/21/18 0001      Manual Therapy   Manual Therapy  Manual Lymphatic Drainage (MLD);Compression Bandaging;Edema management    Edema Management  bandages removed and pt washed arm with soap and water at sink    Manual Lymphatic Drainage (MLD)  short neck, superficial and deep abdominals, left axillary nodes and establishment of interaxillary pathway, right inguinal nodes and establishment of right axillo inguinal pathway, RUE working proximal to distal then retracing all steps then to left sidelying to work on posterior back lymphedema then back to supine finishing with pathways    Compression Bandaging  lotion applied, thick stockinette, artiflex from hand to axilla, 1 6cm, 1 8cm, 1 10cm and 1 12 cm from hand to axilla                  PT Long Term Goals - 03/09/18 1236      PT LONG TERM GOAL #1   Title  Pt will be independent in self manual lymphatic drainage for long term management of lymphedema    Time  4    Period  Weeks    Status  New    Target Date  04/06/18      PT LONG TERM GOAL #2   Title  Pt will demonstrate a 5 cm decrease in lymphedema at 15 cm proximal to olecranon to decrease risk of cellulitis.     Baseline  45.5    Time  4    Period  Weeks    Status  New    Target Date  04/06/18      PT LONG TERM GOAL #3   Title  Pt will demonstrate 160 degrees of right shoulder flexion to allow her to reach items overhead    Baseline  149    Time  4    Period  Weeks    Status  New    Target Date  04/06/18      PT LONG TERM GOAL #4   Title  Pt will demonstrate 160 degrees of right shoulder abduction to allow her to reach out to sides    Baseline  137    Time  4    Period  Weeks    Status  New    Target Date  04/06/18      PT LONG TERM GOAL #5   Title   Pt will receive appropriate compression garments for long term management of lymphedema (flat knit 20-30 sleeve and glove, Tribute night, circular knit 30-40 sleeve and glove)    Time  4    Period  Weeks    Status  New    Target Date  04/06/18            Plan - 03/21/18 1148    Clinical Impression Statement  Continued with MLD and bandaging to RUE. Increased skin mobility noted in R upper arm as evidenced by wrinkles visible in this area. Will measure circumference on Friday.     Rehab Potential  Excellent    Clinical Impairments Affecting Rehab Potential  previous abdominal surgeries and breast surgeries     PT Frequency  3x / week    PT Duration  4 weeks    PT Treatment/Interventions  Manual lymph drainage;Compression bandaging;ADLs/Self Care Home Management;Therapeutic exercise;Therapeutic activities;Patient/family education;Orthotic Fit/Training;Manual techniques;Scar mobilization;Vasopneumatic Device;Taping;Passive range of motion    PT Next Visit Plan  complete decongestive therapy including bandaging to RUE, once pt is maximally reduced she would benefit from a 20-30 flat knit sleeve and glove, 30-40 circular knit sleeve and glove and tribute night wrap     PT Home Exercise Plan  self MLD    Consulted and Agree with Plan of Care  Patient       Patient will benefit from skilled therapeutic intervention in order to improve the following deficits and impairments:  Increased edema, Decreased knowledge of precautions, Decreased range of motion, Decreased strength, Impaired UE functional use, Postural dysfunction, Decreased scar mobility, Pain  Visit Diagnosis: Postmastectomy lymphedema  Stiffness of right shoulder, not elsewhere classified  Pain in right upper arm     Problem List Patient Active Problem List   Diagnosis Date Noted  . Need for prophylactic vaccination against Streptococcus pneumoniae (pneumococcus) 01/05/2014  . Need for prophylactic vaccination and  inoculation against influenza 09/12/2013  . Impacted cerumen of right ear 04/20/2013  . Eustachian tube dysfunction 04/20/2013  . Otitis, externa, infective 04/20/2013  . TMJ (temporomandibular joint  disorder) 04/15/2013  . Unspecified vitamin D deficiency 04/15/2013  . Hyperlipidemia 04/15/2013  . Hip pain 03/03/2013  . Depression 03/03/2013  . Bursitis of hip 03/03/2013    Allyson Sabal [facility] 03/21/2018, 11:49 AM  [place], [place], [address] Phone: [phone]   Fax:  [phone]  Name: DARIAN ACE MRN: [id number] Date of Birth: 1951-08-28  Manus Gunning, PT 03/21/18 11:49 AM

## 2018-03-23 ENCOUNTER — Encounter: Payer: Self-pay | Admitting: Physical Therapy

## 2018-03-23 ENCOUNTER — Ambulatory Visit: Payer: PPO | Admitting: Physical Therapy

## 2018-03-23 DIAGNOSIS — I972 Postmastectomy lymphedema syndrome: Secondary | ICD-10-CM | POA: Diagnosis not present

## 2018-03-23 NOTE — Therapy (Signed)
[place], [place], [address] Phone: [phone]   Fax:  [phone]  Physical Therapy Treatment  Patient Details  Name: Holly Key MRN: [id number] Date of Birth: Mar 04, 1951 Referring Provider: Wendee Beavers   Encounter Date: 03/23/2018  PT End of Session - 03/23/18 1208    Visit Number  4    Number of Visits  13    Date for PT Re-Evaluation  04/06/18    PT Start Time  0935    PT Stop Time  1016    PT Time Calculation (min)  41 min    Activity Tolerance  Patient tolerated treatment well    Behavior During Therapy  [facility] for tasks assessed/performed       Past Medical History:  Diagnosis Date  . Anxiety   . Atrial premature complex   . Breast cancer [facility]) 2003   Right Breast   . Colon polyp   . Depression   . GERD (gastroesophageal reflux disease)   . Pneumonia     Past Surgical History:  Procedure Laterality Date  . BREAST RECONSTRUCTION Right 2004  . GASTRIC FUNDOPLICATION    . MASTECTOMY Right 2003    There were no vitals filed for this visit.  Subjective Assessment - 03/23/18 0944    Subjective  The bandages stayed up. I want to know if my arm went down.     Pertinent History  2003 right mastectomy 5 sentinel nodes removed; then 6 rounds of chemo, no radiation.  Had been doing fine but a year and a half ago, went bowling.  After that had pain in right elbow and shoulder, then axilla, and then swelling that started proximal and moved down into the hand.  Both pain and swelling have worsened.  HHad breast reconstruction in 2004 with implant.  Had two basal cell skin cancers on chin and neck this year.  Blood pressure elevates to around 140 when she is in pain.  Two years ago had pneumonia and was hospitalized for 5 days.      Patient Stated Goals  relief of pain and swelling    Currently in Pain?  No/denies    Pain Score  0-No pain            LYMPHEDEMA/ONCOLOGY QUESTIONNAIRE -  03/23/18 0944      Right Upper Extremity Lymphedema   15 cm Proximal to Olecranon Process  42.5 cm    Olecranon Process  32.5 cm    15 cm Proximal to Ulnar Styloid Process  30 cm    Just Proximal to Ulnar Styloid Process  17.8 cm    Across Hand at [employer]  18 cm    At [place] of 2nd Digit  5.8 cm           Outpatient Re[facility] from [facility]/09/2018 [facility]  Lymphedema Life Impact Scale Total Score  30.88 %           [facility] Adult PT Treatment/Exercise - 03/23/18 0001      Manual Therapy   Manual Therapy  Manual Lymphatic Drainage (MLD);Compression Bandaging;Edema management    Edema Management  bandages removed and pt washed arm with soap and water at sink    Manual Lymphatic Drainage (MLD)  short neck, superficial and deep abdominals, left axillary nodes and establishment of interaxillary pathway, right inguinal nodes and establishment of right axillo inguinal pathway, RUE working proximal to distal then retracing all steps then to left  sidelying to work on posterior back lymphedema then back to supine finishing with pathways    Compression Bandaging  lotion applied, thick stockinette, artiflex from hand to axilla, 1 6cm, 1 8cm, 1 10cm and 1 12 cm from hand to axilla                  PT Long Term Goals - 03/09/18 1236      PT LONG TERM GOAL #1   Title  Pt will be independent in self manual lymphatic drainage for long term management of lymphedema    Time  4    Period  Weeks    Status  New    Target Date  04/06/18      PT LONG TERM GOAL #2   Title  Pt will demonstrate a 5 cm decrease in lymphedema at 15 cm proximal to olecranon to decrease risk of cellulitis.     Baseline  45.5    Time  4    Period  Weeks    Status  New    Target Date  04/06/18      PT LONG TERM GOAL #3   Title  Pt will demonstrate 160 degrees of right shoulder flexion to allow her to reach items overhead    Baseline  149    Time  4    Period  Weeks     Status  New    Target Date  04/06/18      PT LONG TERM GOAL #4   Title  Pt will demonstrate 160 degrees of right shoulder abduction to allow her to reach out to sides    Baseline  137    Time  4    Period  Weeks    Status  New    Target Date  04/06/18      PT LONG TERM GOAL #5   Title  Pt will receive appropriate compression garments for long term management of lymphedema (flat knit 20-30 sleeve and glove, Tribute night, circular knit 30-40 sleeve and glove)    Time  4    Period  Weeks    Status  New    Target Date  04/06/18            Plan - 03/23/18 1208    Clinical Impression Statement  Continued with MD and bandaging to RUE. Remeasured circumference of RUE and pt demonstrates a 3 cm reduction in edema at her upper arm. Will measure again on Monday and will add additional bandages or foam if rest of arm has not decreased.     Rehab Potential  Excellent    Clinical Impairments Affecting Rehab Potential  previous abdominal surgeries and breast surgeries     PT Frequency  3x / week    PT Duration  4 weeks    PT Treatment/Interventions  Manual lymph drainage;Compression bandaging;ADLs/Self Care Home Management;Therapeutic exercise;Therapeutic activities;Patient/family education;Orthotic Fit/Training;Manual techniques;Scar mobilization;Vasopneumatic Device;Taping;Passive range of motion    PT Next Visit Plan  complete decongestive therapy including bandaging to RUE, once pt is maximally reduced she would benefit from a 20-30 flat knit sleeve and glove, 30-40 circular knit sleeve and glove and tribute night wrap     Consulted and Agree with Plan of Care  Patient       Patient will benefit from skilled therapeutic intervention in order to improve the following deficits and impairments:  Increased edema, Decreased knowledge of precautions, Decreased range of motion, Decreased strength, Impaired UE functional use, Postural dysfunction,  Decreased scar mobility, Pain  Visit  Diagnosis: Postmastectomy lymphedema     Problem List Patient Active Problem List   Diagnosis Date Noted  . Need for prophylactic vaccination against Streptococcus pneumoniae (pneumococcus) 01/05/2014  . Need for prophylactic vaccination and inoculation against influenza 09/12/2013  . Impacted cerumen of right ear 04/20/2013  . Eustachian tube dysfunction 04/20/2013  . Otitis, externa, infective 04/20/2013  . TMJ (temporomandibular joint disorder) 04/15/2013  . Unspecified vitamin D deficiency 04/15/2013  . Hyperlipidemia 04/15/2013  . Hip pain 03/03/2013  . Depression 03/03/2013  . Bursitis of hip 03/03/2013    Allyson Sabal [facility] 03/23/2018, 12:11 PM  [place], [place], [address] Phone: [phone]   Fax:  [phone]  Name: ALAYAH KNOUFF MRN: [id number] Date of Birth: [birth year]  Manus Gunning, PT 03/23/18 12:11 PM

## 2018-03-26 ENCOUNTER — Ambulatory Visit: Payer: PPO | Admitting: Physical Therapy

## 2018-03-26 ENCOUNTER — Encounter: Payer: Self-pay | Admitting: Physical Therapy

## 2018-03-26 DIAGNOSIS — I972 Postmastectomy lymphedema syndrome: Secondary | ICD-10-CM

## 2018-03-26 NOTE — Therapy (Signed)
[place], [place], [address] Phone: [phone]   Fax:  [phone]  Physical Therapy Treatment  Patient Details  Name: Holly Key MRN: [id number] Date of Birth: [birth year] Referring Provider: Wendee Beavers   Encounter Date: 03/26/2018  PT End of Session - 03/26/18 1025    Visit Number  5    Number of Visits  13    Date for PT Re-Evaluation  04/06/18    PT Start Time  0935    PT Stop Time  1020    PT Time Calculation (min)  45 min    Activity Tolerance  Patient tolerated treatment well    Behavior During Therapy  [facility] for tasks assessed/performed       Past Medical History:  Diagnosis Date  . Anxiety   . Atrial premature complex   . Breast cancer [facility]) 2003   Right Breast   . Colon polyp   . Depression   . GERD (gastroesophageal reflux disease)   . Pneumonia     Past Surgical History:  Procedure Laterality Date  . BREAST RECONSTRUCTION Right 2004  . GASTRIC FUNDOPLICATION    . MASTECTOMY Right 2003    There were no vitals filed for this visit.  Subjective Assessment - 03/26/18 0940    Subjective  The bandages got very loose over the weekend but today they feel very tight.     Pertinent History  2003 right mastectomy 5 sentinel nodes removed; then 6 rounds of chemo, no radiation.  Had been doing fine but a year and a half ago, went bowling.  After that had pain in right elbow and shoulder, then axilla, and then swelling that started proximal and moved down into the hand.  Both pain and swelling have worsened.  HHad breast reconstruction in 2004 with implant.  Had two basal cell skin cancers on chin and neck this year.  Blood pressure elevates to around 140 when she is in pain.  Two years ago had pneumonia and was hospitalized for 5 days.      Patient Stated Goals  relief of pain and swelling    Currently in Pain?  No/denies    Pain Score  0-No pain            LYMPHEDEMA/ONCOLOGY  QUESTIONNAIRE - 03/26/18 0941      Right Upper Extremity Lymphedema   15 cm Proximal to Olecranon Process  42 cm    Olecranon Process  31 cm    15 cm Proximal to Ulnar Styloid Process  30.7 cm    Just Proximal to Ulnar Styloid Process  19 cm    Across Hand at [employer]  18.5 cm    At [place] of 2nd Digit  6.5 cm           Outpatient [facility]  Lymphedema Life Impact Scale Total Score  30.88 %           [facility] Adult PT Treatment/Exercise - 03/26/18 0001      Manual Therapy   Manual Therapy  Manual Lymphatic Drainage (MLD);Compression Bandaging;Edema management    Edema Management  circumference measurements taken, bandages removed and pt washed arm with soap and water at sink    Manual Lymphatic Drainage (MLD)  short neck, superficial and deep abdominals, left axillary nodes and establishment of interaxillary pathway, right inguinal nodes and establishment of right axillo inguinal pathway, RUE working proximal to distal then retracing all  steps then to left sidelying to work on posterior back lymphedema then back to supine finishing with pathways    Compression Bandaging  lotion applied, thick stockinette, artiflex from hand to axilla, 1 6cm, 1 8cm, 1 10cm and 2 12 cm from hand to axilla                  PT Long Term Goals - 03/09/18 1236      PT LONG TERM GOAL #1   Title  Pt will be independent in self manual lymphatic drainage for long term management of lymphedema    Time  4    Period  Weeks    Status  New    Target Date  04/06/18      PT LONG TERM GOAL #2   Title  Pt will demonstrate a 5 cm decrease in lymphedema at 15 cm proximal to olecranon to decrease risk of cellulitis.     Baseline  45.5    Time  4    Period  Weeks    Status  New    Target Date  04/06/18      PT LONG TERM GOAL #3   Title  Pt will demonstrate 160 degrees of right shoulder flexion to allow her to reach items overhead     Baseline  149    Time  4    Period  Weeks    Status  New    Target Date  04/06/18      PT LONG TERM GOAL #4   Title  Pt will demonstrate 160 degrees of right shoulder abduction to allow her to reach out to sides    Baseline  137    Time  4    Period  Weeks    Status  New    Target Date  04/06/18      PT LONG TERM GOAL #5   Title  Pt will receive appropriate compression garments for long term management of lymphedema (flat knit 20-30 sleeve and glove, Tribute night, circular knit 30-40 sleeve and glove)    Time  4    Period  Weeks    Status  New    Target Date  04/06/18            Plan - 03/26/18 1026    Clinical Impression Statement  Remeasured circumference of RUE today and pt's measurements have increased below the elbow but had decreased from elbow to axilla. Added an extra 12 cm bandage today to see if this helps reduce swelling more. May add foam if she does not demonstrate more reduction.     Rehab Potential  Excellent    Clinical Impairments Affecting Rehab Potential  previous abdominal surgeries and breast surgeries     PT Frequency  3x / week    PT Duration  4 weeks    PT Treatment/Interventions  Manual lymph drainage;Compression bandaging;ADLs/Self Care Home Management;Therapeutic exercise;Therapeutic activities;Patient/family education;Orthotic Fit/Training;Manual techniques;Scar mobilization;Vasopneumatic Device;Taping;Passive range of motion    PT Next Visit Plan  complete decongestive therapy including bandaging to RUE, once pt is maximally reduced she would benefit from a 20-30 flat knit sleeve and glove, 30-40 circular knit sleeve and glove and tribute night wrap     PT Home Exercise Plan  self MLD    Consulted and Agree with Plan of Care  Patient       Patient will benefit from skilled therapeutic intervention in order to improve the following deficits and impairments:  Increased edema,  Decreased knowledge of precautions, Decreased range of motion,  Decreased strength, Impaired UE functional use, Postural dysfunction, Decreased scar mobility, Pain  Visit Diagnosis: Postmastectomy lymphedema     Problem List Patient Active Problem List   Diagnosis Date Noted  . Need for prophylactic vaccination against Streptococcus pneumoniae (pneumococcus) 01/05/2014  . Need for prophylactic vaccination and inoculation against influenza 09/12/2013  . Impacted cerumen of right ear 04/20/2013  . Eustachian tube dysfunction 04/20/2013  . Otitis, externa, infective 04/20/2013  . TMJ (temporomandibular joint disorder) 04/15/2013  . Unspecified vitamin D deficiency 04/15/2013  . Hyperlipidemia 04/15/2013  . Hip pain 03/03/2013  . Depression 03/03/2013  . Bursitis of hip 03/03/2013    Allyson Sabal [facility] 03/26/2018, 10:27 AM  [place], [place], [address] Phone: [phone]   Fax:  [phone]  Name: Holly Key MRN: [id number] Date of Birth: [birth year]  Manus Gunning, PT 03/26/18 10:28 AM

## 2018-03-28 ENCOUNTER — Encounter: Payer: Self-pay | Admitting: Physical Therapy

## 2018-03-28 ENCOUNTER — Ambulatory Visit: Payer: PPO | Admitting: Physical Therapy

## 2018-03-28 DIAGNOSIS — I972 Postmastectomy lymphedema syndrome: Secondary | ICD-10-CM

## 2018-03-28 DIAGNOSIS — M79621 Pain in right upper arm: Secondary | ICD-10-CM

## 2018-03-28 NOTE — Therapy (Signed)
[place], [place], [address] Phone: [phone]   Fax:  [phone]  Physical Therapy Treatment  Patient Details  Name: Holly Key MRN: [id number] Date of Birth: Apr 06, 1951 Referring Provider: Wendee Beavers   Encounter Date: 03/28/2018  PT End of Session - 03/28/18 1517    Visit Number  6    Number of Visits  13    Date for PT Re-Evaluation  04/06/18    PT Start Time  1430    PT Stop Time  1515    PT Time Calculation (min)  45 min    Activity Tolerance  Patient tolerated treatment well    Behavior During Therapy  [facility] for tasks assessed/performed       Past Medical History:  Diagnosis Date  . Anxiety   . Atrial premature complex   . Breast cancer [facility]) 2003   Right Breast   . Colon polyp   . Depression   . GERD (gastroesophageal reflux disease)   . Pneumonia     Past Surgical History:  Procedure Laterality Date  . BREAST RECONSTRUCTION Right 2004  . GASTRIC FUNDOPLICATION    . MASTECTOMY Right 2003    There were no vitals filed for this visit.  Subjective Assessment - 03/28/18 1431    Subjective  The bandages were a little uncomfortable but today they have been painful.     Pertinent History  2003 right mastectomy 5 sentinel nodes removed; then 6 rounds of chemo, no radiation.  Had been doing fine but a year and a half ago, went bowling.  After that had pain in right elbow and shoulder, then axilla, and then swelling that started proximal and moved down into the hand.  Both pain and swelling have worsened.  HHad breast reconstruction in 2004 with implant.  Had two basal cell skin cancers on chin and neck this year.  Blood pressure elevates to around 140 when she is in pain.  Two years ago had pneumonia and was hospitalized for 5 days.      Patient Stated Goals  relief of pain and swelling    Currently in Pain?  Yes    Pain Score  7     Pain Location  Arm    Pain Orientation  Right             LYMPHEDEMA/ONCOLOGY QUESTIONNAIRE - 03/28/18 1439      Right Upper Extremity Lymphedema   15 cm Proximal to Olecranon Process  40 cm    Olecranon Process  30 cm    15 cm Proximal to Ulnar Styloid Process  30 cm    Just Proximal to Ulnar Styloid Process  20 cm    Across Hand at [employer]  18 cm    At [place] of 2nd Digit  5.7 cm           Outpatient Rehab from [facility]/09/2018 [facility]  Lymphedema Life Impact Scale Total Score  30.88 %           [facility] Adult PT Treatment/Exercise - 03/28/18 0001      Manual Therapy   Manual Therapy  Manual Lymphatic Drainage (MLD);Compression Bandaging;Edema management    Edema Management  circumference measurements taken, bandages removed and pt washed arm with soap and water at sink    Manual Lymphatic Drainage (MLD)  short neck, superficial and deep abdominals, left axillary nodes and establishment of interaxillary pathway, right inguinal nodes and establishment of  right axillo inguinal pathway, RUE working proximal to distal then retracing all steps then to left sidelying to work on posterior back lymphedema then back to supine finishing with pathways    Compression Bandaging  lotion applied, thick stockinette, artiflex from hand to axilla, 1 6cm, 1 8cm, 1 10cm and 1 12 cm from hand to axilla                  PT Long Term Goals - 03/09/18 1236      PT LONG TERM GOAL #1   Title  Pt will be independent in self manual lymphatic drainage for long term management of lymphedema    Time  4    Period  Weeks    Status  New    Target Date  04/06/18      PT LONG TERM GOAL #2   Title  Pt will demonstrate a 5 cm decrease in lymphedema at 15 cm proximal to olecranon to decrease risk of cellulitis.     Baseline  45.5    Time  4    Period  Weeks    Status  New    Target Date  04/06/18      PT LONG TERM GOAL #3   Title  Pt will demonstrate 160 degrees of right shoulder flexion to  allow her to reach items overhead    Baseline  149    Time  4    Period  Weeks    Status  New    Target Date  04/06/18      PT LONG TERM GOAL #4   Title  Pt will demonstrate 160 degrees of right shoulder abduction to allow her to reach out to sides    Baseline  137    Time  4    Period  Weeks    Status  New    Target Date  04/06/18      PT LONG TERM GOAL #5   Title  Pt will receive appropriate compression garments for long term management of lymphedema (flat knit 20-30 sleeve and glove, Tribute night, circular knit 30-40 sleeve and glove)    Time  4    Period  Weeks    Status  New    Target Date  04/06/18            Plan - 03/28/18 1518    Clinical Impression Statement  Pt had increased pain today when she arrived with bandages intact. Since last visit an extra bandage was added that extra bandage was not added today. She did demonstrate about a 2 cm decrease throughout RUE and now her RUE is within 4 cm of her LUE. Will measure again Friday to see if 4 bandages managed her swelling.     Rehab Potential  Excellent    Clinical Impairments Affecting Rehab Potential  previous abdominal surgeries and breast surgeries     PT Frequency  3x / week    PT Duration  4 weeks    PT Treatment/Interventions  Manual lymph drainage;Compression bandaging;ADLs/Self Care Home Management;Therapeutic exercise;Therapeutic activities;Patient/family education;Orthotic Fit/Training;Manual techniques;Scar mobilization;Vasopneumatic Device;Taping;Passive range of motion    PT Next Visit Plan  complete decongestive therapy including bandaging to RUE, once pt is maximally reduced she would benefit from a 20-30 flat knit sleeve and glove, 30-40 circular knit sleeve and glove and tribute night wrap     PT Home Exercise Plan  self MLD    Consulted and Agree with Plan of Care  Patient       Patient will benefit from skilled therapeutic intervention in order to improve the following deficits and impairments:   Increased edema, Decreased knowledge of precautions, Decreased range of motion, Decreased strength, Impaired UE functional use, Postural dysfunction, Decreased scar mobility, Pain  Visit Diagnosis: Postmastectomy lymphedema  Pain in right upper arm     Problem List Patient Active Problem List   Diagnosis Date Noted  . Need for prophylactic vaccination against Streptococcus pneumoniae (pneumococcus) 01/05/2014  . Need for prophylactic vaccination and inoculation against influenza 09/12/2013  . Impacted cerumen of right ear 04/20/2013  . Eustachian tube dysfunction 04/20/2013  . Otitis, externa, infective 04/20/2013  . TMJ (temporomandibular joint disorder) 04/15/2013  . Unspecified vitamin D deficiency 04/15/2013  . Hyperlipidemia 04/15/2013  . Hip pain 03/03/2013  . Depression 03/03/2013  . Bursitis of hip 03/03/2013    Allyson Sabal [facility] 03/28/2018, 3:20 PM  [place], [place], [address] Phone: [phone]   Fax:  [phone]  Name: JAIMA JANNEY MRN: [id number] Date of Birth: 07/20/1951  Manus Gunning, PT 03/28/18 3:20 PM

## 2018-03-30 ENCOUNTER — Encounter: Payer: Self-pay | Admitting: Physical Therapy

## 2018-03-30 ENCOUNTER — Ambulatory Visit: Payer: PPO | Admitting: Physical Therapy

## 2018-03-30 DIAGNOSIS — I972 Postmastectomy lymphedema syndrome: Secondary | ICD-10-CM | POA: Diagnosis not present

## 2018-03-30 DIAGNOSIS — M79621 Pain in right upper arm: Secondary | ICD-10-CM

## 2018-03-30 NOTE — Therapy (Signed)
[place], [place], [address] Phone: [phone]   Fax:  [phone]  Physical Therapy Treatment  Patient Details  Name: Holly Key MRN: [id number] Date of Birth: Feb 11, 1951 Referring Provider: Wendee Beavers   Encounter Date: 03/30/2018  PT End of Session - 03/30/18 1105    Visit Number  7    Number of Visits  13    Date for PT Re-Evaluation  04/06/18    PT Start Time  1016    PT Stop Time  1105    PT Time Calculation (min)  49 min    Activity Tolerance  Patient tolerated treatment well    Behavior During Therapy  [facility] for tasks assessed/performed       Past Medical History:  Diagnosis Date  . Anxiety   . Atrial premature complex   . Breast cancer [facility]) 2003   Right Breast   . Colon polyp   . Depression   . GERD (gastroesophageal reflux disease)   . Pneumonia     Past Surgical History:  Procedure Laterality Date  . BREAST RECONSTRUCTION Right 2004  . GASTRIC FUNDOPLICATION    . MASTECTOMY Right 2003    There were no vitals filed for this visit.  Subjective Assessment - 03/30/18 1017    Subjective  The bandages felt tight but not as painful as last time.     Pertinent History  2003 right mastectomy 5 sentinel nodes removed; then 6 rounds of chemo, no radiation.  Had been doing fine but a year and a half ago, went bowling.  After that had pain in right elbow and shoulder, then axilla, and then swelling that started proximal and moved down into the hand.  Both pain and swelling have worsened.  HHad breast reconstruction in 2004 with implant.  Had two basal cell skin cancers on chin and neck this year.  Blood pressure elevates to around 140 when she is in pain.  Two years ago had pneumonia and was hospitalized for 5 days.      Patient Stated Goals  relief of pain and swelling    Currently in Pain?  Yes    Pain Score  4     Pain Location  Arm    Pain Orientation  Right             LYMPHEDEMA/ONCOLOGY QUESTIONNAIRE - 03/30/18 1023      Right Upper Extremity Lymphedema   15 cm Proximal to Olecranon Process  41.5 cm    Olecranon Process  33 cm    15 cm Proximal to Ulnar Styloid Process  30 cm    Just Proximal to Ulnar Styloid Process  20.3 cm    Across Hand at [employer]  18.4 cm    At [place] of 2nd Digit  6 cm           Outpatient Re[facility] from [facility]/09/[facility]  Lymphedema Life Impact Scale Total Score  30.88 %           [facility] Adult PT Treatment/Exercise - 03/30/18 0001      Manual Therapy   Manual Therapy  Manual Lymphatic Drainage (MLD);Compression Bandaging;Edema management    Edema Management  circumference measurements taken, bandages removed and pt washed arm with soap and water at sink    Manual Lymphatic Drainage (MLD)  short neck, superficial and deep abdominals, left axillary nodes and establishment of interaxillary pathway, right inguinal nodes and establishment of right  axillo inguinal pathway, RUE working proximal to distal then retracing all steps then to left sidelying to work on posterior back lymphedema then back to supine finishing with pathways    Compression Bandaging  lotion applied, thick stockinette, artiflex from hand to axilla, 10 cm Rosidal foam from wrist to axilla, 1 6cm, 1 8cm, 1 10cm and 1 12 cm from hand to axilla                  PT Long Term Goals - 03/09/18 1236      PT LONG TERM GOAL #1   Title  Pt will be independent in self manual lymphatic drainage for long term management of lymphedema    Time  4    Period  Weeks    Status  New    Target Date  04/06/18      PT LONG TERM GOAL #2   Title  Pt will demonstrate a 5 cm decrease in lymphedema at 15 cm proximal to olecranon to decrease risk of cellulitis.     Baseline  45.5    Time  4    Period  Weeks    Status  New    Target Date  04/06/18      PT LONG TERM GOAL #3   Title  Pt will demonstrate  160 degrees of right shoulder flexion to allow her to reach items overhead    Baseline  149    Time  4    Period  Weeks    Status  New    Target Date  04/06/18      PT LONG TERM GOAL #4   Title  Pt will demonstrate 160 degrees of right shoulder abduction to allow her to reach out to sides    Baseline  137    Time  4    Period  Weeks    Status  New    Target Date  04/06/18      PT LONG TERM GOAL #5   Title  Pt will receive appropriate compression garments for long term management of lymphedema (flat knit 20-30 sleeve and glove, Tribute night, circular knit 30-40 sleeve and glove)    Time  4    Period  Weeks    Status  New    Target Date  04/06/18            Plan - 03/30/18 1105    Clinical Impression Statement  Pt demonstrates some increase in swelling today without the additional bandage but she did have less pain. Her olecranon was up 3 cm from last session. Today added a layer of Rosidal from wrist to axilla for additional compression. Will remeasure next session and discuss different garment options with patient.     Rehab Potential  Excellent    Clinical Impairments Affecting Rehab Potential  previous abdominal surgeries and breast surgeries     PT Frequency  3x / week    PT Duration  4 weeks    PT Next Visit Plan  recert, send rx to dr to be signed, show garments and possible solaris vest for back swelling, complete decongestive therapy including bandaging to RUE, once pt is maximally reduced she would benefit from a 20-30 flat knit sleeve and glove, 30-40 circular knit sleeve and glove and tribute night wrap     PT Home Exercise Plan  self MLD    Consulted and Agree with Plan of Care  Patient       Patient  will benefit from skilled therapeutic intervention in order to improve the following deficits and impairments:  Increased edema, Decreased knowledge of precautions, Decreased range of motion, Decreased strength, Impaired UE functional use, Postural dysfunction,  Decreased scar mobility, Pain  Visit Diagnosis: Postmastectomy lymphedema  Pain in right upper arm     Problem List Patient Active Problem List   Diagnosis Date Noted  . Need for prophylactic vaccination against Streptococcus pneumoniae (pneumococcus) 01/05/2014  . Need for prophylactic vaccination and inoculation against influenza 09/12/2013  . Impacted cerumen of right ear 04/20/2013  . Eustachian tube dysfunction 04/20/2013  . Otitis, externa, infective 04/20/2013  . TMJ (temporomandibular joint disorder) 04/15/2013  . Unspecified vitamin D deficiency 04/15/2013  . Hyperlipidemia 04/15/2013  . Hip pain 03/03/2013  . Depression 03/03/2013  . Bursitis of hip 03/03/2013    Allyson Sabal [facility] 03/30/2018, 11:07 AM  [place], [place], [address] Phone: [phone]   Fax:  [phone]  Name: Holly Key MRN: [id number] Date of Birth: 1951-07-12  Manus Gunning, PT 03/30/18 11:07 AM

## 2018-04-02 ENCOUNTER — Encounter: Payer: Self-pay | Admitting: Physical Therapy

## 2018-04-02 ENCOUNTER — Ambulatory Visit: Payer: PPO | Admitting: Physical Therapy

## 2018-04-02 DIAGNOSIS — R350 Frequency of micturition: Secondary | ICD-10-CM | POA: Diagnosis not present

## 2018-04-02 DIAGNOSIS — M79621 Pain in right upper arm: Secondary | ICD-10-CM

## 2018-04-02 DIAGNOSIS — I972 Postmastectomy lymphedema syndrome: Secondary | ICD-10-CM

## 2018-04-02 DIAGNOSIS — N950 Postmenopausal bleeding: Secondary | ICD-10-CM | POA: Diagnosis not present

## 2018-04-02 NOTE — Therapy (Signed)
[place], [place], [address] Phone: [phone]   Fax:  [phone]  Physical Therapy Treatment  Patient Details  Name: Holly Key MRN: [id number] Date of Birth: 01/07/1951 Referring Provider: Wendee Beavers   Encounter Date: 04/02/2018  PT End of Session - 04/02/18 1154    Visit Number  8    Number of Visits  13    Date for PT Re-Evaluation  04/06/18    PT Start Time  6962    PT Stop Time  1102    PT Time Calculation (min)  47 min    Activity Tolerance  Patient tolerated treatment well    Behavior During Therapy  [facility] for tasks assessed/performed       Past Medical History:  Diagnosis Date  . Anxiety   . Atrial premature complex   . Breast cancer [facility]) 2003   Right Breast   . Colon polyp   . Depression   . GERD (gastroesophageal reflux disease)   . Pneumonia     Past Surgical History:  Procedure Laterality Date  . BREAST RECONSTRUCTION Right 2004  . GASTRIC FUNDOPLICATION    . MASTECTOMY Right 2003    There were no vitals filed for this visit.  Subjective Assessment - 04/02/18 1017    Subjective  The bandages went really well. The foam was the solution. No painful spots.     Pertinent History  2003 right mastectomy 5 sentinel nodes removed; then 6 rounds of chemo, no radiation.  Had been doing fine but a year and a half ago, went bowling.  After that had pain in right elbow and shoulder, then axilla, and then swelling that started proximal and moved down into the hand.  Both pain and swelling have worsened.  HHad breast reconstruction in 2004 with implant.  Had two basal cell skin cancers on chin and neck this year.  Blood pressure elevates to around 140 when she is in pain.  Two years ago had pneumonia and was hospitalized for 5 days.      Patient Stated Goals  relief of pain and swelling    Currently in Pain?  No/denies    Pain Score  0-No pain            LYMPHEDEMA/ONCOLOGY  QUESTIONNAIRE - 04/02/18 1026      Right Upper Extremity Lymphedema   15 cm Proximal to Olecranon Process  40.9 cm    Olecranon Process  31.5 cm    15 cm Proximal to Ulnar Styloid Process  29.8 cm    Just Proximal to Ulnar Styloid Process  20 cm    Across Hand at [employer]  18.1 cm    At [place] of 2nd Digit  5.7 cm           Outpatient [facility]09/[facility]  Lymphedema Life Impact Scale Total Score  30.88 %           [facility] Adult PT Treatment/Exercise - 04/02/18 0001      Manual Therapy   Manual Therapy  Manual Lymphatic Drainage (MLD);Compression Bandaging;Edema management    Edema Management  circumference measurements taken, bandages removed and pt washed arm with soap and water at sink    Manual Lymphatic Drainage (MLD)  short neck, superficial and deep abdominals, left axillary nodes and establishment of interaxillary pathway, right inguinal nodes and establishment of right axillo inguinal pathway, RUE working proximal to distal then retracing all steps  then to left sidelying to work on posterior back lymphedema then back to supine finishing with pathways    Compression Bandaging  lotion applied, thick stockinette, artiflex from hand to axilla, 10 cm Rosidal foam from wrist to axilla, 1 6cm, 1 8cm, 1 10cm and 1 12 cm from hand to axilla                  PT Long Term Goals - 03/09/18 1236      PT LONG TERM GOAL #1   Title  Pt will be independent in self manual lymphatic drainage for long term management of lymphedema    Time  4    Period  Weeks    Status  New    Target Date  04/06/18      PT LONG TERM GOAL #2   Title  Pt will demonstrate a 5 cm decrease in lymphedema at 15 cm proximal to olecranon to decrease risk of cellulitis.     Baseline  45.5    Time  4    Period  Weeks    Status  New    Target Date  04/06/18      PT LONG TERM GOAL #3   Title  Pt will demonstrate 160 degrees of right shoulder  flexion to allow her to reach items overhead    Baseline  149    Time  4    Period  Weeks    Status  New    Target Date  04/06/18      PT LONG TERM GOAL #4   Title  Pt will demonstrate 160 degrees of right shoulder abduction to allow her to reach out to sides    Baseline  137    Time  4    Period  Weeks    Status  New    Target Date  04/06/18      PT LONG TERM GOAL #5   Title  Pt will receive appropriate compression garments for long term management of lymphedema (flat knit 20-30 sleeve and glove, Tribute night, circular knit 30-40 sleeve and glove)    Time  4    Period  Weeks    Status  New    Target Date  04/06/18            Plan - 04/02/18 1155    Clinical Impression Statement  Pt demonstrates reduction throughout RUE today. She reports the foam was much more comfortable and she did not have any pain. Pt has not reached maximal reduction yet.     Rehab Potential  Excellent    Clinical Impairments Affecting Rehab Potential  previous abdominal surgeries and breast surgeries     PT Frequency  3x / week    PT Duration  4 weeks    PT Treatment/Interventions  Manual lymph drainage;Compression bandaging;ADLs/Self Care Home Management;Therapeutic exercise;Therapeutic activities;Patient/family education;Orthotic Fit/Training;Manual techniques;Scar mobilization;Vasopneumatic Device;Taping;Passive range of motion    PT Next Visit Plan  recert, send rx to dr to be signed, show garments and possible solaris vest for back swelling, complete decongestive therapy including bandaging to RUE, once pt is maximally reduced she would benefit from a 20-30 flat knit sleeve and glove, 30-40 circular knit sleeve and glove and tribute night wrap     PT Home Exercise Plan  self MLD    Consulted and Agree with Plan of Care  Patient       Patient will benefit from skilled therapeutic intervention in order to improve the  following deficits and impairments:  Increased edema, Decreased knowledge of  precautions, Decreased range of motion, Decreased strength, Impaired UE functional use, Postural dysfunction, Decreased scar mobility, Pain  Visit Diagnosis: Postmastectomy lymphedema  Pain in right upper arm     Problem List Patient Active Problem List   Diagnosis Date Noted  . Need for prophylactic vaccination against Streptococcus pneumoniae (pneumococcus) 01/05/2014  . Need for prophylactic vaccination and inoculation against influenza 09/12/2013  . Impacted cerumen of right ear 04/20/2013  . Eustachian tube dysfunction 04/20/2013  . Otitis, externa, infective 04/20/2013  . TMJ (temporomandibular joint disorder) 04/15/2013  . Unspecified vitamin D deficiency 04/15/2013  . Hyperlipidemia 04/15/2013  . Hip pain 03/03/2013  . Depression 03/03/2013  . Bursitis of hip 03/03/2013    Allyson Sabal [facility] 04/02/2018, 11:56 AM  [place], [place], [address] Phone: [phone]   Fax:  [phone]  Name: Holly Key MRN: [id number] Date of Birth: 18-May-1951  Manus Gunning, PT 04/02/18 11:57 AM

## 2018-04-03 DIAGNOSIS — R079 Chest pain, unspecified: Secondary | ICD-10-CM | POA: Diagnosis not present

## 2018-04-03 DIAGNOSIS — E7800 Pure hypercholesterolemia, unspecified: Secondary | ICD-10-CM | POA: Diagnosis not present

## 2018-04-04 ENCOUNTER — Ambulatory Visit: Payer: PPO | Admitting: Physical Therapy

## 2018-04-04 DIAGNOSIS — I972 Postmastectomy lymphedema syndrome: Secondary | ICD-10-CM

## 2018-04-04 DIAGNOSIS — M79621 Pain in right upper arm: Secondary | ICD-10-CM

## 2018-04-04 NOTE — Therapy (Signed)
[place], [place], [address] Phone: [phone]   Fax:  [phone]  Physical Therapy Treatment  Patient Details  Name: Holly Key MRN: [id number] Date of Birth: 1951-02-23 Referring Provider: Wendee Beavers   Encounter Date: 04/04/2018  PT End of Session - 04/04/18 1226    Visit Number  9    Number of Visits  13    Date for PT Re-Evaluation  04/06/18    PT Start Time  1016    PT Stop Time  1103    PT Time Calculation (min)  47 min    Activity Tolerance  Patient tolerated treatment well    Behavior During Therapy  [facility] for tasks assessed/performed       Past Medical History:  Diagnosis Date  . Anxiety   . Atrial premature complex   . Breast cancer [facility]) 2003   Right Breast   . Colon polyp   . Depression   . GERD (gastroesophageal reflux disease)   . Pneumonia     Past Surgical History:  Procedure Laterality Date  . BREAST RECONSTRUCTION Right 2004  . GASTRIC FUNDOPLICATION    . MASTECTOMY Right 2003    There were no vitals filed for this visit.  Subjective Assessment - 04/04/18 1019    Subjective  We've been using the foam and it's worked really well. Last night it was very tight at the top and this morning it was very comfortable.    Pertinent History  2003 right mastectomy 5 sentinel nodes removed; then 6 rounds of chemo, no radiation.  Had been doing fine but a year and a half ago, went bowling.  After that had pain in right elbow and shoulder, then axilla, and then swelling that started proximal and moved down into the hand.  Both pain and swelling have worsened.  HHad breast reconstruction in 2004 with implant.  Had two basal cell skin cancers on chin and neck this year.  Blood pressure elevates to around 140 when she is in pain.  Two years ago had pneumonia and was hospitalized for 5 days.      Patient Stated Goals  relief of pain and swelling    Currently in Pain?  No/denies             LYMPHEDEMA/ONCOLOGY QUESTIONNAIRE - 04/04/18 1024      Right Upper Extremity Lymphedema   15 cm Proximal to Olecranon Process  41.1 cm    Olecranon Process  30.6 cm    15 cm Proximal to Ulnar Styloid Process  30.1 cm    Just Proximal to Ulnar Styloid Process  18.8 cm    Across Hand at [employer]  18.9 cm    At [place] of 2nd Digit  6.1 cm           Outpatient Re[facility] from [facility]/09/2018 [facility]  Lymphedema Life Impact Scale Total Score  30.88 %           [facility] Adult PT Treatment/Exercise - 04/04/18 0001      Manual Therapy   Edema Management  circumference measurements taken, bandages removed and pt washed arm with soap and water at sink    Manual Lymphatic Drainage (MLD)  short neck, superficial and deep abdominals, left axillary nodes and establishment of interaxillary pathway, right inguinal nodes and establishment of right axillo inguinal pathway, RUE working proximal to distal then retracing all steps then to left sidelying to work on posterior  back lymphedema     Compression Bandaging  lotion applied, thick stockinette, artiflex from hand to axilla, 10 cm Rosidal foam from wrist to axilla, 1 6cm, 1 8cm, 1 10cm and 1 12 cm from hand to axilla                  PT Long Term Goals - 04/04/18 1233      PT LONG TERM GOAL #1   Title  Pt will be independent in self manual lymphatic drainage for long term management of lymphedema    Status  On-going      PT LONG TERM GOAL #2   Title  Pt will demonstrate a 5 cm decrease in lymphedema at 15 cm proximal to olecranon to decrease risk of cellulitis.     Status  On-going      PT LONG TERM GOAL #3   Title  Pt will demonstrate 160 degrees of right shoulder flexion to allow her to reach items overhead    Status  On-going      PT LONG TERM GOAL #4   Title  Pt will demonstrate 160 degrees of right shoulder abduction to allow her to reach out to sides    Status  On-going       PT LONG TERM GOAL #5   Title  Pt will receive appropriate compression garments for long term management of lymphedema (flat knit 20-30 sleeve and glove, Tribute night, circular knit 30-40 sleeve and glove)    Status  On-going            Plan - 04/04/18 1227    Clinical Impression Statement  Pt. did better with tolerating bandages with use of the Rosidal foam. Circumferences were down at a couple of levels but up a little bit at several levels today.     Rehab Potential  Excellent    Clinical Impairments Affecting Rehab Potential  previous abdominal surgeries and breast surgeries     PT Frequency  3x / week    PT Duration  4 weeks    PT Treatment/Interventions  Manual lymph drainage;Compression bandaging;ADLs/Self Care Home Management;Therapeutic exercise;Therapeutic activities;Patient/family education;Orthotic Fit/Training;Manual techniques;Scar mobilization;Vasopneumatic Device;Taping;Passive range of motion    PT Next Visit Plan  Recert done this visit. Send rx to dr to be signed, show garments and possible solaris vest for back swelling, complete decongestive therapy including bandaging to RUE, once pt is maximally reduced she would benefit from a 20-30 flat knit sleeve and glove, 30-40 circular knit sleeve and glove and tribute night wrap     PT Home Exercise Plan  self MLD    Consulted and Agree with Plan of Care  Patient       Patient will benefit from skilled therapeutic intervention in order to improve the following deficits and impairments:  Increased edema, Decreased knowledge of precautions, Decreased range of motion, Decreased strength, Impaired UE functional use, Postural dysfunction, Decreased scar mobility, Pain  Visit Diagnosis: Postmastectomy lymphedema - Plan: PT plan of care cert/re-cert  Pain in right upper arm - Plan: PT plan of care cert/re-cert     Problem List Patient Active Problem List   Diagnosis Date Noted  . Need for prophylactic vaccination  against Streptococcus pneumoniae (pneumococcus) 01/05/2014  . Need for prophylactic vaccination and inoculation against influenza 09/12/2013  . Impacted cerumen of right ear 04/20/2013  . Eustachian tube dysfunction 04/20/2013  . Otitis, externa, infective 04/20/2013  . TMJ (temporomandibular joint disorder) 04/15/2013  . Unspecified vitamin D deficiency  04/15/2013  . Hyperlipidemia 04/15/2013  . Hip pain 03/03/2013  . Depression 03/03/2013  . Bursitis of hip 03/03/2013    Holly Key 04/04/2018, 12:34 PM  [place], [place], [address] Phone: [phone]   Fax:  [phone]  Name: Holly Key MRN: [id number] Date of Birth: 12-Apr-1951  [name], PT 04/04/18 12:34 PM

## 2018-04-06 ENCOUNTER — Ambulatory Visit: Payer: PPO | Admitting: Physical Therapy

## 2018-04-06 ENCOUNTER — Encounter: Payer: Self-pay | Admitting: Physical Therapy

## 2018-04-06 DIAGNOSIS — M79621 Pain in right upper arm: Secondary | ICD-10-CM

## 2018-04-06 DIAGNOSIS — I972 Postmastectomy lymphedema syndrome: Secondary | ICD-10-CM

## 2018-04-06 DIAGNOSIS — M6281 Muscle weakness (generalized): Secondary | ICD-10-CM

## 2018-04-06 DIAGNOSIS — M25611 Stiffness of right shoulder, not elsewhere classified: Secondary | ICD-10-CM

## 2018-04-06 NOTE — Therapy (Signed)
Progress Note Reporting Period 03/09/18 to 04/06/18  See note below for Objective Data and Assessment of Progress/Goals.      [place], [place], [address] Phone: [phone]   Fax:  [phone]  Physical Therapy Treatment  Patient Details  Name: Holly Key MRN: [id number] Date of Birth: 1951-03-02 Referring Provider: Wendee Beavers   Encounter Date: 04/06/2018  PT End of Session - 04/06/18 1205    Visit Number  10    Number of Visits  25    Date for PT Re-Evaluation  05/04/18    PT Start Time  1025    PT Stop Time  1104    PT Time Calculation (min)  39 min    Activity Tolerance  Patient tolerated treatment well    Behavior During Therapy  [facility] for tasks assessed/performed       Past Medical History:  Diagnosis Date  . Anxiety   . Atrial premature complex   . Breast cancer [facility]) 2003   Right Breast   . Colon polyp   . Depression   . GERD (gastroesophageal reflux disease)   . Pneumonia     Past Surgical History:  Procedure Laterality Date  . BREAST RECONSTRUCTION Right 2004  . GASTRIC FUNDOPLICATION    . MASTECTOMY Right 2003    There were no vitals filed for this visit.  Subjective Assessment - 04/06/18 1034    Subjective  The bandages felt fine.     Pertinent History  2003 right mastectomy 5 sentinel nodes removed; then 6 rounds of chemo, no radiation.  Had been doing fine but a year and a half ago, went bowling.  After that had pain in right elbow and shoulder, then axilla, and then swelling that started proximal and moved down into the hand.  Both pain and swelling have worsened.  HHad breast reconstruction in 2004 with implant.  Had two basal cell skin cancers on chin and neck this year.  Blood pressure elevates to around 140 when she is in pain.  Two years ago had pneumonia and was hospitalized for 5 days.      Patient Stated Goals  relief of pain and swelling    Currently in  Pain?  Yes    Pain Score  2     Pain Location  Shoulder    Pain Orientation  Right            LYMPHEDEMA/ONCOLOGY QUESTIONNAIRE - 04/06/18 1035      Right Upper Extremity Lymphedema   15 cm Proximal to Olecranon Process  40.1 cm    Olecranon Process  30.5 cm    15 cm Proximal to Ulnar Styloid Process  30 cm    Just Proximal to Ulnar Styloid Process  19.4 cm    Across Hand at [employer]  18.3 cm    At [place] of 2nd Digit  6 cm           Outpatient Rehab from [facility]/09/[facility]  Lymphedema Life Impact Scale Total Score  30.88 %           [facility] Adult PT Treatment/Exercise - 04/06/18 0001      Manual Therapy   Edema Management  circumference measurements taken, bandages removed and pt washed arm with soap and water at sink    Manual Lymphatic Drainage (MLD)  short neck, superficial and deep abdominals, left axillary nodes and establishment of interaxillary pathway, right inguinal nodes  and establishment of right axillo inguinal pathway, RUE working proximal to distal then retracing all steps then to left sidelying to work on posterior back lymphedema     Compression Bandaging  lotion applied, thick stockinette, artiflex from hand to axilla, 10 cm Rosidal foam from wrist to axilla, 1 6cm, 1 8cm, 1 10cm and 1 12 cm from hand to axilla                  PT Long Term Goals - 04/06/18 1206      PT LONG TERM GOAL #1   Title  Pt will be independent in self manual lymphatic drainage for long term management of lymphedema    Time  4    Period  Weeks    Status  On-going      PT LONG TERM GOAL #2   Title  Pt will demonstrate a 5 cm decrease in lymphedema at 15 cm proximal to olecranon to decrease risk of cellulitis.     Baseline  45.5, 04/06/18- 40.1    Time  4    Period  Weeks    Status  Achieved      PT LONG TERM GOAL #3   Title  Pt will demonstrate 160 degrees of right shoulder flexion to allow her to reach items  overhead    Baseline  149    Time  4    Period  Weeks    Status  On-going      PT LONG TERM GOAL #4   Title  Pt will demonstrate 160 degrees of right shoulder abduction to allow her to reach out to sides    Baseline  137    Time  4    Period  Weeks    Status  On-going      PT LONG TERM GOAL #5   Title  Pt will receive appropriate compression garments for long term management of lymphedema (flat knit 20-30 sleeve and glove, Tribute night, circular knit 30-40 sleeve and glove)    Time  4    Period  Weeks    Status  On-going            Plan - 04/06/18 1208    Clinical Impression Statement  Recert performed today. Updated goals. Have not begun to work on ROM of shoulder yet because focus has been placed on lymphedema treatment. She has met her reduction goal at 15 cm proximal to olecranon. She continued to demonstrate reduction of circumference throughout RUE today. She would benefit from continued skilled PT services to acheive maximal reduction so she can be fit for compression garments and would also benefit from a home exercise program to improve R shoulder ROM.     Rehab Potential  Excellent    Clinical Impairments Affecting Rehab Potential  previous abdominal surgeries and breast surgeries     PT Frequency  3x / week    PT Duration  4 weeks    PT Treatment/Interventions  Manual lymph drainage;Compression bandaging;ADLs/Self Care Home Management;Therapeutic exercise;Therapeutic activities;Patient/family education;Orthotic Fit/Training;Manual techniques;Scar mobilization;Vasopneumatic Device;Taping;Passive range of motion    PT Next Visit Plan  Send rx to dr to be signed, show garments and possible solaris vest for back swelling, complete decongestive therapy including bandaging to RUE, once pt is maximally reduced she would benefit from a 20-30 flat knit sleeve and glove, 30-40 circular knit sleeve and glove and tribute night wrap     PT Home Exercise Plan  self MLD  Consulted  and Agree with Plan of Care  Patient       Patient will benefit from skilled therapeutic intervention in order to improve the following deficits and impairments:  Increased edema, Decreased knowledge of precautions, Decreased range of motion, Decreased strength, Impaired UE functional use, Postural dysfunction, Decreased scar mobility, Pain  Visit Diagnosis: Postmastectomy lymphedema  Pain in right upper arm  Stiffness of right shoulder, not elsewhere classified  Muscle weakness (generalized)     Problem List Patient Active Problem List   Diagnosis Date Noted  . Need for prophylactic vaccination against Streptococcus pneumoniae (pneumococcus) 01/05/2014  . Need for prophylactic vaccination and inoculation against influenza 09/12/2013  . Impacted cerumen of right ear 04/20/2013  . Eustachian tube dysfunction 04/20/2013  . Otitis, externa, infective 04/20/2013  . TMJ (temporomandibular joint disorder) 04/15/2013  . Unspecified vitamin D deficiency 04/15/2013  . Hyperlipidemia 04/15/2013  . Hip pain 03/03/2013  . Depression 03/03/2013  . Bursitis of hip 03/03/2013    Allyson Sabal [facility] 04/06/2018, 12:11 PM  [place], [place], [address] Phone: [phone]   Fax:  [phone]  Name: MIATA CULBRETH MRN: [id number] Date of Birth: Mar 01, 1951  Manus Gunning, PT 04/06/18 12:12 PM

## 2018-04-09 ENCOUNTER — Ambulatory Visit: Payer: PPO | Attending: Hematology and Oncology | Admitting: Physical Therapy

## 2018-04-09 ENCOUNTER — Encounter: Payer: Self-pay | Admitting: Physical Therapy

## 2018-04-09 DIAGNOSIS — M79621 Pain in right upper arm: Secondary | ICD-10-CM | POA: Diagnosis not present

## 2018-04-09 DIAGNOSIS — M25611 Stiffness of right shoulder, not elsewhere classified: Secondary | ICD-10-CM | POA: Insufficient documentation

## 2018-04-09 DIAGNOSIS — I972 Postmastectomy lymphedema syndrome: Secondary | ICD-10-CM | POA: Diagnosis not present

## 2018-04-09 DIAGNOSIS — M6281 Muscle weakness (generalized): Secondary | ICD-10-CM | POA: Insufficient documentation

## 2018-04-09 NOTE — Therapy (Signed)
[place], [place], [address] Phone: [phone]   Fax:  [phone]  Physical Therapy Treatment  Patient Details  Name: Holly Key MRN: [id number] Date of Birth: 07/13/1951 Referring Provider: Wendee Beavers   Encounter Date: 04/09/2018  PT End of Session - 04/09/18 1104    Visit Number  11    Number of Visits  25    Date for PT Re-Evaluation  05/04/18    PT Start Time  1018    PT Stop Time  1103    PT Time Calculation (min)  45 min    Activity Tolerance  Patient tolerated treatment well    Behavior During Therapy  [facility] for tasks assessed/performed       Past Medical History:  Diagnosis Date  . Anxiety   . Atrial premature complex   . Breast cancer [facility]) 2003   Right Breast   . Colon polyp   . Depression   . GERD (gastroesophageal reflux disease)   . Pneumonia     Past Surgical History:  Procedure Laterality Date  . BREAST RECONSTRUCTION Right 2004  . GASTRIC FUNDOPLICATION    . MASTECTOMY Right 2003    There were no vitals filed for this visit.  Subjective Assessment - 04/09/18 1020    Subjective  My arm feels terrible. I think it is the arthritis in my shoulder. I took a celebrex and the pain went completely away.     Pertinent History  2003 right mastectomy 5 sentinel nodes removed; then 6 rounds of chemo, no radiation.  Had been doing fine but a year and a half ago, went bowling.  After that had pain in right elbow and shoulder, then axilla, and then swelling that started proximal and moved down into the hand.  Both pain and swelling have worsened.  HHad breast reconstruction in 2004 with implant.  Had two basal cell skin cancers on chin and neck this year.  Blood pressure elevates to around 140 when she is in pain.  Two years ago had pneumonia and was hospitalized for 5 days.      Patient Stated Goals  relief of pain and swelling    Currently in Pain?  Yes    Pain Score  7     Pain  Location  Shoulder    Pain Orientation  Right            LYMPHEDEMA/ONCOLOGY QUESTIONNAIRE - 04/09/18 1027      Right Upper Extremity Lymphedema   15 cm Proximal to Olecranon Process  40.2 cm    Olecranon Process  31.5 cm    15 cm Proximal to Ulnar Styloid Process  30.5 cm    Just Proximal to Ulnar Styloid Process  19.7 cm    Across Hand at [employer]  18.7 cm    At [place] of 2nd Digit  6 cm           Outpatient Re[facility]09/[facility]  Lymphedema Life Impact Scale Total Score  30.88 %           [facility] Adult PT Treatment/Exercise - 04/09/18 0001      Manual Therapy   Edema Management  circumference measurements taken, bandages removed and pt washed arm with soap and water at sink    Manual Lymphatic Drainage (MLD)  short neck, superficial and deep abdominals, left axillary nodes and establishment of interaxillary pathway, right inguinal nodes and establishment of right  axillo inguinal pathway, RUE working proximal to distal then retracing all steps then to left sidelying to work on posterior back lymphedema     Compression Bandaging  lotion applied, thick stockinette, artiflex from hand to axilla, 10 cm Rosidal foam from wrist to axilla, 1 6cm, 1 8cm, 1 10cm and 1 12 cm from hand to axilla                  PT Long Term Goals - 04/06/18 1206      PT LONG TERM GOAL #1   Title  Pt will be independent in self manual lymphatic drainage for long term management of lymphedema    Time  4    Period  Weeks    Status  On-going      PT LONG TERM GOAL #2   Title  Pt will demonstrate a 5 cm decrease in lymphedema at 15 cm proximal to olecranon to decrease risk of cellulitis.     Baseline  45.5, 04/06/18- 40.1    Time  4    Period  Weeks    Status  Achieved      PT LONG TERM GOAL #3   Title  Pt will demonstrate 160 degrees of right shoulder flexion to allow her to reach items overhead    Baseline  149    Time  4     Period  Weeks    Status  On-going      PT LONG TERM GOAL #4   Title  Pt will demonstrate 160 degrees of right shoulder abduction to allow her to reach out to sides    Baseline  137    Time  4    Period  Weeks    Status  On-going      PT LONG TERM GOAL #5   Title  Pt will receive appropriate compression garments for long term management of lymphedema (flat knit 20-30 sleeve and glove, Tribute night, circular knit 30-40 sleeve and glove)    Time  4    Period  Weeks    Status  On-going            Plan - 04/09/18 1106    Clinical Impression Statement  Pt is having increased pain in right shoulder and feels it is from arthritis because it got better after taking celebrex. Re measured circumference today and pt has increased very slightly from last visit which could be due to shoulder inflammation. She is going to try and follow up with her orthopedic doctor.     Rehab Potential  Excellent    Clinical Impairments Affecting Rehab Potential  previous abdominal surgeries and breast surgeries     PT Frequency  3x / week    PT Duration  4 weeks    PT Treatment/Interventions  Manual lymph drainage;Compression bandaging;ADLs/Self Care Home Management;Therapeutic exercise;Therapeutic activities;Patient/family education;Orthotic Fit/Training;Manual techniques;Scar mobilization;Vasopneumatic Device;Taping;Passive range of motion    PT Next Visit Plan  Send rx to dr to be signed, show garments and possible solaris vest for back swelling, complete decongestive therapy including bandaging to RUE, once pt is maximally reduced she would benefit from a 20-30 flat knit sleeve and glove, 30-40 circular knit sleeve and glove and tribute night wrap     PT Home Exercise Plan  self MLD    Consulted and Agree with Plan of Care  Patient       Patient will benefit from skilled therapeutic intervention in order to improve the following deficits and  impairments:  Increased edema, Decreased knowledge of  precautions, Decreased range of motion, Decreased strength, Impaired UE functional use, Postural dysfunction, Decreased scar mobility, Pain  Visit Diagnosis: Postmastectomy lymphedema  Pain in right upper arm     Problem List Patient Active Problem List   Diagnosis Date Noted  . Need for prophylactic vaccination against Streptococcus pneumoniae (pneumococcus) 01/05/2014  . Need for prophylactic vaccination and inoculation against influenza 09/12/2013  . Impacted cerumen of right ear 04/20/2013  . Eustachian tube dysfunction 04/20/2013  . Otitis, externa, infective 04/20/2013  . TMJ (temporomandibular joint disorder) 04/15/2013  . Unspecified vitamin D deficiency 04/15/2013  . Hyperlipidemia 04/15/2013  . Hip pain 03/03/2013  . Depression 03/03/2013  . Bursitis of hip 03/03/2013    Allyson Sabal [facility] 04/09/2018, 11:48 AM  [place], [place], [address] Phone: [phone]   Fax:  [phone]  Name: Holly Key MRN: [id number] Date of Birth: January 06, 1951  Manus Gunning, PT 04/09/18 11:48 AM

## 2018-04-11 ENCOUNTER — Encounter: Payer: Self-pay | Admitting: Physical Therapy

## 2018-04-11 ENCOUNTER — Ambulatory Visit: Payer: PPO | Admitting: Physical Therapy

## 2018-04-11 DIAGNOSIS — M50321 Other cervical disc degeneration at C4-C5 level: Secondary | ICD-10-CM | POA: Diagnosis not present

## 2018-04-11 DIAGNOSIS — M5031 Other cervical disc degeneration,  high cervical region: Secondary | ICD-10-CM | POA: Diagnosis not present

## 2018-04-11 DIAGNOSIS — I972 Postmastectomy lymphedema syndrome: Secondary | ICD-10-CM

## 2018-04-11 DIAGNOSIS — M7551 Bursitis of right shoulder: Secondary | ICD-10-CM | POA: Diagnosis not present

## 2018-04-11 DIAGNOSIS — M50322 Other cervical disc degeneration at C5-C6 level: Secondary | ICD-10-CM | POA: Diagnosis not present

## 2018-04-11 DIAGNOSIS — M79621 Pain in right upper arm: Secondary | ICD-10-CM

## 2018-04-11 DIAGNOSIS — M5030 Other cervical disc degeneration, unspecified cervical region: Secondary | ICD-10-CM | POA: Diagnosis not present

## 2018-04-11 NOTE — Therapy (Signed)
[place], [place], [address] Phone: [phone]   Fax:  [phone]  Physical Therapy Treatment  Patient Details  Name: Holly Key MRN: [id number] Date of Birth: 09-Jul-1951 Referring Provider: Wendee Beavers   Encounter Date: 04/11/2018  PT End of Session - 04/11/18 1216    Visit Number  12    Number of Visits  25    Date for PT Re-Evaluation  05/04/18    PT Start Time  1008    PT Stop Time  1102    PT Time Calculation (min)  54 min    Activity Tolerance  Patient tolerated treatment well    Behavior During Therapy  [facility] for tasks assessed/performed       Past Medical History:  Diagnosis Date  . Anxiety   . Atrial premature complex   . Breast cancer [facility]) 2003   Right Breast   . Colon polyp   . Depression   . GERD (gastroesophageal reflux disease)   . Pneumonia     Past Surgical History:  Procedure Laterality Date  . BREAST RECONSTRUCTION Right 2004  . GASTRIC FUNDOPLICATION    . MASTECTOMY Right 2003    There were no vitals filed for this visit.  Subjective Assessment - 04/11/18 1010    Subjective  My arm was okay.  My shoulder was terrible. I see my orthopedic doctor this afternoon.     Pertinent History  2003 right mastectomy 5 sentinel nodes removed; then 6 rounds of chemo, no radiation.  Had been doing fine but a year and a half ago, went bowling.  After that had pain in right elbow and shoulder, then axilla, and then swelling that started proximal and moved down into the hand.  Both pain and swelling have worsened.  HHad breast reconstruction in 2004 with implant.  Had two basal cell skin cancers on chin and neck this year.  Blood pressure elevates to around 140 when she is in pain.  Two years ago had pneumonia and was hospitalized for 5 days.      Patient Stated Goals  relief of pain and swelling    Currently in Pain?  Yes    Pain Score  3     Pain Location  Shoulder    Pain  Orientation  Right    Pain Descriptors / Indicators  Aching            LYMPHEDEMA/ONCOLOGY QUESTIONNAIRE - 04/11/18 1016      Right Upper Extremity Lymphedema   15 cm Proximal to Olecranon Process  39 cm    Olecranon Process  31.1 cm    15 cm Proximal to Ulnar Styloid Process  29.6 cm    Just Proximal to Ulnar Styloid Process  19 cm    Across Hand at [employer]  19 cm    At [place] of 2nd Digit  6 cm           Outpatient Rehab from [facility]/09/2018 [facility]  Lymphedema Life Impact Scale Total Score  30.88 %           [facility] Adult PT Treatment/Exercise - 04/11/18 0001      Manual Therapy   Edema Management  circumference measurements taken, bandages removed and pt washed arm with soap and water at sink    Manual Lymphatic Drainage (MLD)  short neck, superficial and deep abdominals, left axillary nodes and establishment of interaxillary pathway, right inguinal nodes and establishment  of right axillo inguinal pathway, RUE working proximal to distal then retracing all steps then to left sidelying to work on posterior back lymphedema     Compression Bandaging  lotion applied, thick stockinette, gray foam to back of hand and around thumb, artiflex from hand to axilla, 10 cm Rosidal foam from wrist to axilla, 1 6cm, 1 8cm, 1 10cm and 1 12 cm from hand to axilla                  PT Long Term Goals - 04/06/18 1206      PT LONG TERM GOAL #1   Title  Pt will be independent in self manual lymphatic drainage for long term management of lymphedema    Time  4    Period  Weeks    Status  On-going      PT LONG TERM GOAL #2   Title  Pt will demonstrate a 5 cm decrease in lymphedema at 15 cm proximal to olecranon to decrease risk of cellulitis.     Baseline  45.5, 04/06/18- 40.1    Time  4    Period  Weeks    Status  Achieved      PT LONG TERM GOAL #3   Title  Pt will demonstrate 160 degrees of right shoulder flexion to allow her to  reach items overhead    Baseline  149    Time  4    Period  Weeks    Status  On-going      PT LONG TERM GOAL #4   Title  Pt will demonstrate 160 degrees of right shoulder abduction to allow her to reach out to sides    Baseline  137    Time  4    Period  Weeks    Status  On-going      PT LONG TERM GOAL #5   Title  Pt will receive appropriate compression garments for long term management of lymphedema (flat knit 20-30 sleeve and glove, Tribute night, circular knit 30-40 sleeve and glove)    Time  4    Period  Weeks    Status  On-going            Plan - 04/11/18 1216    Clinical Impression Statement  Pt demonstrates some reduction in circumferences today. Added foam to back of hand and around thumb where she demonstrated some increase in edema. Continued with MLD and compression bandaging. Once pt demonstrates maximal reduction she will be ready to be measured for compression garments.     Rehab Potential  Excellent    Clinical Impairments Affecting Rehab Potential  previous abdominal surgeries and breast surgeries     PT Frequency  3x / week    PT Duration  4 weeks    PT Treatment/Interventions  Manual lymph drainage;Compression bandaging;ADLs/Self Care Home Management;Therapeutic exercise;Therapeutic activities;Patient/family education;Orthotic Fit/Training;Manual techniques;Scar mobilization;Vasopneumatic Device;Taping;Passive range of motion    PT Next Visit Plan  Send rx to dr to be signed, show garments and possible solaris vest for back swelling, complete decongestive therapy including bandaging to RUE, once pt is maximally reduced she would benefit from a 20-30 flat knit sleeve and glove, 30-40 circular knit sleeve and glove and tribute night wrap     PT Home Exercise Plan  self MLD    Consulted and Agree with Plan of Care  Patient       Patient will benefit from skilled therapeutic intervention in order to improve the following  deficits and impairments:  Increased edema,  Decreased knowledge of precautions, Decreased range of motion, Decreased strength, Impaired UE functional use, Postural dysfunction, Decreased scar mobility, Pain  Visit Diagnosis: Postmastectomy lymphedema  Pain in right upper arm     Problem List Patient Active Problem List   Diagnosis Date Noted  . Need for prophylactic vaccination against Streptococcus pneumoniae (pneumococcus) 01/05/2014  . Need for prophylactic vaccination and inoculation against influenza 09/12/2013  . Impacted cerumen of right ear 04/20/2013  . Eustachian tube dysfunction 04/20/2013  . Otitis, externa, infective 04/20/2013  . TMJ (temporomandibular joint disorder) 04/15/2013  . Unspecified vitamin D deficiency 04/15/2013  . Hyperlipidemia 04/15/2013  . Hip pain 03/03/2013  . Depression 03/03/2013  . Bursitis of hip 03/03/2013    Allyson Sabal [facility] 04/11/2018, 12:20 PM  [place], [place], [address] Phone: [phone]   Fax:  [phone]  Name: Holly Key MRN: [id number] Date of Birth: 1951/09/29  Manus Gunning, PT 04/11/18 12:20 PM

## 2018-04-13 ENCOUNTER — Ambulatory Visit: Payer: PPO | Admitting: Physical Therapy

## 2018-04-13 ENCOUNTER — Encounter: Payer: Self-pay | Admitting: Physical Therapy

## 2018-04-13 DIAGNOSIS — I972 Postmastectomy lymphedema syndrome: Secondary | ICD-10-CM

## 2018-04-13 DIAGNOSIS — M79621 Pain in right upper arm: Secondary | ICD-10-CM

## 2018-04-13 NOTE — Therapy (Signed)
[place], [place], [address] Phone: [phone]   Fax:  [phone]  Physical Therapy Treatment  Patient Details  Name: MARISELA LINE MRN: [id number] Date of Birth: 1950-11-30 Referring Provider: Wendee Beavers   Encounter Date: 04/13/2018  PT End of Session - 04/13/18 1200    Visit Number  13    Number of Visits  25    Date for PT Re-Evaluation  05/04/18    PT Start Time  1019    PT Stop Time  1103    PT Time Calculation (min)  44 min    Activity Tolerance  Patient tolerated treatment well    Behavior During Therapy  [facility] for tasks assessed/performed       Past Medical History:  Diagnosis Date  . Anxiety   . Atrial premature complex   . Breast cancer [facility]) 2003   Right Breast   . Colon polyp   . Depression   . GERD (gastroesophageal reflux disease)   . Pneumonia     Past Surgical History:  Procedure Laterality Date  . BREAST RECONSTRUCTION Right 2004  . GASTRIC FUNDOPLICATION    . MASTECTOMY Right 2003    There were no vitals filed for this visit.  Subjective Assessment - 04/13/18 1030    Subjective  I got a cortisone shot in my shoulder yesterday afternoon.     Pertinent History  2003 right mastectomy 5 sentinel nodes removed; then 6 rounds of chemo, no radiation.  Had been doing fine but a year and a half ago, went bowling.  After that had pain in right elbow and shoulder, then axilla, and then swelling that started proximal and moved down into the hand.  Both pain and swelling have worsened.  HHad breast reconstruction in 2004 with implant.  Had two basal cell skin cancers on chin and neck this year.  Blood pressure elevates to around 140 when she is in pain.  Two years ago had pneumonia and was hospitalized for 5 days.      Patient Stated Goals  relief of pain and swelling    Currently in Pain?  Yes    Pain Score  6     Pain Location  Shoulder    Pain Orientation  Right             LYMPHEDEMA/ONCOLOGY QUESTIONNAIRE - 04/13/18 1031      Right Upper Extremity Lymphedema   15 cm Proximal to Olecranon Process  40.8 cm    Olecranon Process  31.5 cm    15 cm Proximal to Ulnar Styloid Process  29.5 cm    Just Proximal to Ulnar Styloid Process  17.5 cm    Across Hand at [employer]  18.5 cm    At [place] of 2nd Digit  5.8 cm           Outpatient Rehab from [facility]/09/2018 [facility]  Lymphedema Life Impact Scale Total Score  30.88 %           [facility] Adult PT Treatment/Exercise - 04/13/18 0001      Manual Therapy   Edema Management  circumference measurements taken, bandages removed and pt washed arm with soap and water at sink    Manual Lymphatic Drainage (MLD)  short neck, superficial and deep abdominals, left axillary nodes and establishment of interaxillary pathway, right inguinal nodes and establishment of right axillo inguinal pathway, RUE working proximal to distal then retracing all steps then  to left sidelying to work on posterior back lymphedema     Compression Bandaging  lotion applied, thick stockinette, gray foam to back of hand and around thumb, artiflex from hand to axilla, 10 cm Rosidal foam from wrist to axilla, 1 6cm, 1 8cm, 1 10cm and 1 12 cm from hand to axilla                  PT Long Term Goals - 04/06/18 1206      PT LONG TERM GOAL #1   Title  Pt will be independent in self manual lymphatic drainage for long term management of lymphedema    Time  4    Period  Weeks    Status  On-going      PT LONG TERM GOAL #2   Title  Pt will demonstrate a 5 cm decrease in lymphedema at 15 cm proximal to olecranon to decrease risk of cellulitis.     Baseline  45.5, 04/06/18- 40.1    Time  4    Period  Weeks    Status  Achieved      PT LONG TERM GOAL #3   Title  Pt will demonstrate 160 degrees of right shoulder flexion to allow her to reach items overhead    Baseline  149    Time  4     Period  Weeks    Status  On-going      PT LONG TERM GOAL #4   Title  Pt will demonstrate 160 degrees of right shoulder abduction to allow her to reach out to sides    Baseline  137    Time  4    Period  Weeks    Status  On-going      PT LONG TERM GOAL #5   Title  Pt will receive appropriate compression garments for long term management of lymphedema (flat knit 20-30 sleeve and glove, Tribute night, circular knit 30-40 sleeve and glove)    Time  4    Period  Weeks    Status  On-going            Plan - 04/13/18 1200    Clinical Impression Statement  Pt demonstrates reduction of wrist and hand edema since addition of grey foam to this area. She received a cortisone shot in her shoulder because she has bursitis. Her upper arm was slightly more full than last session. Educated pt on different types of compression garments and will send Rx to be signed by Dr for flat knit compression sleeve, gauntlet and tribute night wrap arm piece.     Rehab Potential  Excellent    Clinical Impairments Affecting Rehab Potential  previous abdominal surgeries and breast surgeries     PT Frequency  3x / week    PT Duration  4 weeks    PT Treatment/Interventions  Manual lymph drainage;Compression bandaging;ADLs/Self Care Home Management;Therapeutic exercise;Therapeutic activities;Patient/family education;Orthotic Fit/Training;Manual techniques;Scar mobilization;Vasopneumatic Device;Taping;Passive range of motion    PT Next Visit Plan  Send rx to dr to be signed, show garments and possible solaris vest for back swelling, complete decongestive therapy including bandaging to RUE, once pt is maximally reduced she would benefit from a 20-30 flat knit sleeve and glove, 30-40 circular knit sleeve and glove and tribute night wrap     PT Home Exercise Plan  self MLD    Consulted and Agree with Plan of Care  Patient       Patient will benefit from skilled  therapeutic intervention in order to improve the following  deficits and impairments:  Increased edema, Decreased knowledge of precautions, Decreased range of motion, Decreased strength, Impaired UE functional use, Postural dysfunction, Decreased scar mobility, Pain  Visit Diagnosis: Postmastectomy lymphedema  Pain in right upper arm     Problem List Patient Active Problem List   Diagnosis Date Noted  . Need for prophylactic vaccination against Streptococcus pneumoniae (pneumococcus) 01/05/2014  . Need for prophylactic vaccination and inoculation against influenza 09/12/2013  . Impacted cerumen of right ear 04/20/2013  . Eustachian tube dysfunction 04/20/2013  . Otitis, externa, infective 04/20/2013  . TMJ (temporomandibular joint disorder) 04/15/2013  . Unspecified vitamin D deficiency 04/15/2013  . Hyperlipidemia 04/15/2013  . Hip pain 03/03/2013  . Depression 03/03/2013  . Bursitis of hip 03/03/2013    Allyson Sabal [facility] 04/13/2018, 12:02 PM  [place], [place], [address] Phone: [phone]   Fax:  [phone]  Name: ELLENIE SALOME MRN: [id number] Date of Birth: 1951/05/11  Manus Gunning, PT 04/13/18 12:02 PM

## 2018-04-16 ENCOUNTER — Ambulatory Visit: Payer: PPO

## 2018-04-16 DIAGNOSIS — M79621 Pain in right upper arm: Secondary | ICD-10-CM

## 2018-04-16 DIAGNOSIS — I972 Postmastectomy lymphedema syndrome: Secondary | ICD-10-CM | POA: Diagnosis not present

## 2018-04-16 NOTE — Therapy (Signed)
[place], [place], [address] Phone: [phone]   Fax:  [phone]  Physical Therapy Treatment  Patient Details  Name: Holly Key MRN: [id number] Date of Birth: 1951-03-17 Referring Provider: Wendee Beavers   Encounter Date: 04/16/2018  PT End of Session - 04/16/18 1239    Visit Number  14    Number of Visits  25    Date for PT Re-Evaluation  05/04/18    PT Start Time  1155    PT Stop Time  1220    PT Time Calculation (min)  25 min    Activity Tolerance  Patient tolerated treatment well    Behavior During Therapy  [facility] for tasks assessed/performed       Past Medical History:  Diagnosis Date  . Anxiety   . Atrial premature complex   . Breast cancer [facility]) 2003   Right Breast   . Colon polyp   . Depression   . GERD (gastroesophageal reflux disease)   . Pneumonia     Past Surgical History:  Procedure Laterality Date  . BREAST RECONSTRUCTION Right 2004  . GASTRIC FUNDOPLICATION    . MASTECTOMY Right 2003    There were no vitals filed for this visit.  Subjective Assessment - 04/16/18 1224    Subjective  The hand part of my bandage slid down sometime Saturday and I've been fighting with it ever since.     Pertinent History  2003 right mastectomy 5 sentinel nodes removed; then 6 rounds of chemo, no radiation.  Had been doing fine but a year and a half ago, went bowling.  After that had pain in right elbow and shoulder, then axilla, and then swelling that started proximal and moved down into the hand.  Both pain and swelling have worsened.  HHad breast reconstruction in 2004 with implant.  Had two basal cell skin cancers on chin and neck this year.  Blood pressure elevates to around 140 when she is in pain.  Two years ago had pneumonia and was hospitalized for 5 days.      Patient Stated Goals  relief of pain and swelling    Currently in Pain?  No/denies                  Outpatient  Rehab from [facility]/09/[facility]  Lymphedema Life Impact Scale Total Score  30.88 %           [facility] Adult PT Treatment/Exercise - 04/16/18 0001      Manual Therapy   Edema Management  Removed pts bandages while assessing skin and then she washed arm with soap and water. Min redness at posterior thumb near 1st MCP joint so when rewarpping made sure stockinette and artiflex covered here well.     Compression Bandaging  lotion applied, thick stockinette, gray foam to back of hand and around thumb, artiflex from hand to axilla, 10 cm Rosidal foam from wrist to axilla, 1-6cm, 1-10cm and 2-12 cm from hand to axilla                  PT Long Term Goals - 04/06/18 1206      PT LONG TERM GOAL #1   Title  Pt will be independent in self manual lymphatic drainage for long term management of lymphedema    Time  4    Period  Weeks    Status  On-going      PT LONG TERM  GOAL #2   Title  Pt will demonstrate a 5 cm decrease in lymphedema at 15 cm proximal to olecranon to decrease risk of cellulitis.     Baseline  45.5, 04/06/18- 40.1    Time  4    Period  Weeks    Status  Achieved      PT LONG TERM GOAL #3   Title  Pt will demonstrate 160 degrees of right shoulder flexion to allow her to reach items overhead    Baseline  149    Time  4    Period  Weeks    Status  On-going      PT LONG TERM GOAL #4   Title  Pt will demonstrate 160 degrees of right shoulder abduction to allow her to reach out to sides    Baseline  137    Time  4    Period  Weeks    Status  On-going      PT LONG TERM GOAL #5   Title  Pt will receive appropriate compression garments for long term management of lymphedema (flat knit 20-30 sleeve and glove, Tribute night, circular knit 30-40 sleeve and glove)    Time  4    Period  Weeks    Status  On-going            Plan - 04/16/18 1240    Clinical Impression Statement  Short treat today as our schedule was full but  therapist wanted to make sure pt was able to get rewrapped form the weekend since her next visit isn't until Wednesday. Her hand was visibly increased today to pt and noticeable to therapist but her hand bandage has been sliding off since sometime on Saturday. Instructed pt okay to take bandage off if whole hand bandage slides off again as it did this time increasing her hand circumference. Rebandaged her arm being mindful of min redness at posterior thumb. Pt reported bandage feeling comfortable at end of session.     Rehab Potential  Excellent    Clinical Impairments Affecting Rehab Potential  previous abdominal surgeries and breast surgeries     PT Frequency  3x / week    PT Duration  4 weeks    PT Treatment/Interventions  Manual lymph drainage;Compression bandaging;ADLs/Self Care Home Management;Therapeutic exercise;Therapeutic activities;Patient/family education;Orthotic Fit/Training;Manual techniques;Scar mobilization;Vasopneumatic Device;Taping;Passive range of motion    PT Next Visit Plan  Show garments and possible solaris vest for back swelling, complete decongestive therapy including bandaging to Rt UE, once pt is maximally reduced she would benefit from a 20-30 flat knit sleeve and gauntlet and tribute night wrap for arm only    Recommended Other Services  Sent script to pts doctor for compression garments       Patient will benefit from skilled therapeutic intervention in order to improve the following deficits and impairments:  Increased edema, Decreased knowledge of precautions, Decreased range of motion, Decreased strength, Impaired UE functional use, Postural dysfunction, Decreased scar mobility, Pain  Visit Diagnosis: Postmastectomy lymphedema  Pain in right upper arm     Problem List Patient Active Problem List   Diagnosis Date Noted  . Need for prophylactic vaccination against Streptococcus pneumoniae (pneumococcus) 01/05/2014  . Need for prophylactic vaccination and  inoculation against influenza 09/12/2013  . Impacted cerumen of right ear 04/20/2013  . Eustachian tube dysfunction 04/20/2013  . Otitis, externa, infective 04/20/2013  . TMJ (temporomandibular joint disorder) 04/15/2013  . Unspecified vitamin D deficiency 04/15/2013  . Hyperlipidemia 04/15/2013  .  Hip pain 03/03/2013  . Depression 03/03/2013  . Bursitis of hip 03/03/2013    Otelia Limes, PTA 04/16/2018, 1:04 PM  [place], [place], [address] Phone: [phone]   Fax:  [phone]  Name: Holly Key MRN: [id number] Date of Birth: 1950-10-19

## 2018-04-17 DIAGNOSIS — Z0000 Encounter for general adult medical examination without abnormal findings: Secondary | ICD-10-CM | POA: Diagnosis not present

## 2018-04-17 DIAGNOSIS — Z01419 Encounter for gynecological examination (general) (routine) without abnormal findings: Secondary | ICD-10-CM | POA: Diagnosis not present

## 2018-04-18 ENCOUNTER — Ambulatory Visit: Payer: PPO

## 2018-04-18 DIAGNOSIS — I972 Postmastectomy lymphedema syndrome: Secondary | ICD-10-CM | POA: Diagnosis not present

## 2018-04-18 DIAGNOSIS — M79621 Pain in right upper arm: Secondary | ICD-10-CM

## 2018-04-18 NOTE — Therapy (Signed)
[place], [place], [address] Phone: [phone]   Fax:  [phone]  Physical Therapy Treatment  Patient Details  Name: Holly Key MRN: [id number] Date of Birth: [birth year] Referring Provider: Wendee Beavers   Encounter Date: 04/18/2018  PT End of Session - 04/18/18 1703    Visit Number  15    Number of Visits  25    Date for PT Re-Evaluation  05/04/18    PT Start Time  3220    PT Stop Time  1652    PT Time Calculation (min)  47 min    Activity Tolerance  Patient tolerated treatment well    Behavior During Therapy  [facility] for tasks assessed/performed       Past Medical History:  Diagnosis Date  . Anxiety   . Atrial premature complex   . Breast cancer [facility]) 2003   Right Breast   . Colon polyp   . Depression   . GERD (gastroesophageal reflux disease)   . Pneumonia     Past Surgical History:  Procedure Laterality Date  . BREAST RECONSTRUCTION Right 2004  . GASTRIC FUNDOPLICATION    . MASTECTOMY Right 2003    There were no vitals filed for this visit.  Subjective Assessment - 04/18/18 1609    Subjective  The fingers did not stay on at all. Can we try not wrapping them today?    Pertinent History  2003 right mastectomy 5 sentinel nodes removed; then 6 rounds of chemo, no radiation.  Had been doing fine but a year and a half ago, went bowling.  After that had pain in right elbow and shoulder, then axilla, and then swelling that started proximal and moved down into the hand.  Both pain and swelling have worsened.  HHad breast reconstruction in 2004 with implant.  Had two basal cell skin cancers on chin and neck this year.  Blood pressure elevates to around 140 when she is in pain.  Two years ago had pneumonia and was hospitalized for 5 days.      Patient Stated Goals  relief of pain and swelling    Currently in Pain?  No/denies            LYMPHEDEMA/ONCOLOGY QUESTIONNAIRE - 04/18/18 1609       Right Upper Extremity Lymphedema   15 cm Proximal to Olecranon Process  38.2 cm    10 cm Proximal to Olecranon Process  38.2 cm    Olecranon Process  29.2 cm    15 cm Proximal to Ulnar Styloid Process  29.5 cm    10 cm Proximal to Ulnar Styloid Process  27.5 cm    Just Proximal to Ulnar Styloid Process  19.2 cm    Across Hand at [employer]  18.2 cm    At [place] of 2nd Digit  5.9 cm           Outpatient Rehab from [facility]/09/2018 [facility]  Lymphedema Life Impact Scale Total Score  30.88 %           [facility] Adult PT Treatment/Exercise - 04/18/18 0001      Manual Therapy   Manual Lymphatic Drainage (MLD)  short neck, superficial and deep abdominals, left axillary nodes and establishment of interaxillary pathway, right inguinal nodes and establishment of right axillo inguinal pathway, RUE working proximal to distal then retracing all steps then to left sidelying to work on posterior back lymphedema instructing husband in this throughout  and having him return demonstration    Compression Bandaging  lotion applied, thick stockinette, gray foam to back of hand and around thumb, artiflex from hand to axilla, 10 cm Rosidal foam from wrist to axilla, 1-6cm, 1-10cm and 2-12 cm from hand to axilla                  PT Long Term Goals - 04/06/18 1206      PT LONG TERM GOAL #1   Title  Pt will be independent in self manual lymphatic drainage for long term management of lymphedema    Time  4    Period  Weeks    Status  On-going      PT LONG TERM GOAL #2   Title  Pt will demonstrate a 5 cm decrease in lymphedema at 15 cm proximal to olecranon to decrease risk of cellulitis.     Baseline  45.5, 04/06/18- 40.1    Time  4    Period  Weeks    Status  Achieved      PT LONG TERM GOAL #3   Title  Pt will demonstrate 160 degrees of right shoulder flexion to allow her to reach items overhead    Baseline  149    Time  4    Period  Weeks     Status  On-going      PT LONG TERM GOAL #4   Title  Pt will demonstrate 160 degrees of right shoulder abduction to allow her to reach out to sides    Baseline  137    Time  4    Period  Weeks    Status  On-going      PT LONG TERM GOAL #5   Title  Pt will receive appropriate compression garments for long term management of lymphedema (flat knit 20-30 sleeve and glove, Tribute night, circular knit 30-40 sleeve and glove)    Time  4    Period  Weeks    Status  On-going            Plan - 04/18/18 1704    Clinical Impression Statement  Pts husband came to learn manual lymph drainage today and he did very well returning demonstration only initially requiring tactile and VCs for lighter pressure and correct skin stretch (was using fingertips instead of entire hand). Added extra tape to 6-cm hand bandage as pt reports this has been sliding off easily.     Rehab Potential  Excellent    Clinical Impairments Affecting Rehab Potential  previous abdominal surgeries and breast surgeries     PT Frequency  3x / week    PT Duration  4 weeks    PT Treatment/Interventions  Manual lymph drainage;Compression bandaging;ADLs/Self Care Home Management;Therapeutic exercise;Therapeutic activities;Patient/family education;Orthotic Fit/Training;Manual techniques;Scar mobilization;Vasopneumatic Device;Taping;Passive range of motion    PT Next Visit Plan  Show garments and possible solaris vest for back swelling, complete decongestive therapy including bandaging to Rt UE, once pt is maximally reduced she would benefit from a 20-30 flat knit sleeve and gauntlet and tribute night wrap for arm only    Consulted and Agree with Plan of Care  Patient       Patient will benefit from skilled therapeutic intervention in order to improve the following deficits and impairments:  Increased edema, Decreased knowledge of precautions, Decreased range of motion, Decreased strength, Impaired UE functional use, Postural  dysfunction, Decreased scar mobility, Pain  Visit Diagnosis: Postmastectomy lymphedema  Pain in right upper  arm     Problem List Patient Active Problem List   Diagnosis Date Noted  . Need for prophylactic vaccination against Streptococcus pneumoniae (pneumococcus) 01/05/2014  . Need for prophylactic vaccination and inoculation against influenza 09/12/2013  . Impacted cerumen of right ear 04/20/2013  . Eustachian tube dysfunction 04/20/2013  . Otitis, externa, infective 04/20/2013  . TMJ (temporomandibular joint disorder) 04/15/2013  . Unspecified vitamin D deficiency 04/15/2013  . Hyperlipidemia 04/15/2013  . Hip pain 03/03/2013  . Depression 03/03/2013  . Bursitis of hip 03/03/2013    Otelia Limes, PTA 04/18/2018, 5:15 PM  [place], [place], [address] Phone: [phone]   Fax:  [phone]  Name: Holly Key MRN: [id number] Date of Birth: 08-Nov-1950

## 2018-04-20 ENCOUNTER — Encounter: Payer: Self-pay | Admitting: Physical Therapy

## 2018-04-20 ENCOUNTER — Ambulatory Visit: Payer: PPO | Admitting: Physical Therapy

## 2018-04-20 DIAGNOSIS — I972 Postmastectomy lymphedema syndrome: Secondary | ICD-10-CM

## 2018-04-20 DIAGNOSIS — M79621 Pain in right upper arm: Secondary | ICD-10-CM

## 2018-04-20 NOTE — Therapy (Signed)
[place], [place], [address] Phone: [phone]   Fax:  [phone]  Physical Therapy Treatment  Patient Details  Name: Holly Key MRN: [id number] Date of Birth: [birth year] Referring Provider: Wendee Beavers   Encounter Date: 04/20/2018  PT End of Session - 04/20/18 0855    Visit Number  16    Number of Visits  25    Date for PT Re-Evaluation  05/04/18    PT Start Time  0803    PT Stop Time  0848    PT Time Calculation (min)  45 min    Activity Tolerance  Patient tolerated treatment well    Behavior During Therapy  [facility] for tasks assessed/performed       Past Medical History:  Diagnosis Date  . Anxiety   . Atrial premature complex   . Breast cancer [facility]) 2003   Right Breast   . Colon polyp   . Depression   . GERD (gastroesophageal reflux disease)   . Pneumonia     Past Surgical History:  Procedure Laterality Date  . BREAST RECONSTRUCTION Right 2004  . GASTRIC FUNDOPLICATION    . MASTECTOMY Right 2003    There were no vitals filed for this visit.  Subjective Assessment - 04/20/18 0804    Subjective  The extra taping really worked.     Pertinent History  2003 right mastectomy 5 sentinel nodes removed; then 6 rounds of chemo, no radiation.  Had been doing fine but a year and a half ago, went bowling.  After that had pain in right elbow and shoulder, then axilla, and then swelling that started proximal and moved down into the hand.  Both pain and swelling have worsened.  HHad breast reconstruction in 2004 with implant.  Had two basal cell skin cancers on chin and neck this year.  Blood pressure elevates to around 140 when she is in pain.  Two years ago had pneumonia and was hospitalized for 5 days.      Patient Stated Goals  relief of pain and swelling    Currently in Pain?  No/denies    Pain Score  0-No pain            LYMPHEDEMA/ONCOLOGY QUESTIONNAIRE - 04/20/18 0805      Right  Upper Extremity Lymphedema   15 cm Proximal to Olecranon Process  39.1 cm    Olecranon Process  30 cm    15 cm Proximal to Ulnar Styloid Process  29.1 cm    Just Proximal to Ulnar Styloid Process  18.1 cm    Across Hand at [employer]  19 cm    At [place] of 2nd Digit  6 cm           Outpatient Rehab from [facility]/09/2018 [facility]  Lymphedema Life Impact Scale Total Score  30.88 %           [facility] Adult PT Treatment/Exercise - 04/20/18 0001      Manual Therapy   Manual Lymphatic Drainage (MLD)  short neck, superficial and deep abdominals, left axillary nodes and establishment of interaxillary pathway, right inguinal nodes and establishment of right axillo inguinal pathway, RUE working proximal to distal then retracing all steps then to left sidelying to work on posterior back lymphedema     Compression Bandaging  lotion applied, thick stockinette, artiflex from hand to axilla, 10 cm Rosidal foam from wrist to axilla, 1 6cm, 1 8cm, 1 10cm and  1 12 cm from hand to axilla pt did not have grey foam today                  PT Long Term Goals - 04/06/18 1206      PT LONG TERM GOAL #1   Title  Pt will be independent in self manual lymphatic drainage for long term management of lymphedema    Time  4    Period  Weeks    Status  On-going      PT LONG TERM GOAL #2   Title  Pt will demonstrate a 5 cm decrease in lymphedema at 15 cm proximal to olecranon to decrease risk of cellulitis.     Baseline  45.5, 04/06/18- 40.1    Time  4    Period  Weeks    Status  Achieved      PT LONG TERM GOAL #3   Title  Pt will demonstrate 160 degrees of right shoulder flexion to allow her to reach items overhead    Baseline  149    Time  4    Period  Weeks    Status  On-going      PT LONG TERM GOAL #4   Title  Pt will demonstrate 160 degrees of right shoulder abduction to allow her to reach out to sides    Baseline  137    Time  4    Period  Weeks     Status  On-going      PT LONG TERM GOAL #5   Title  Pt will receive appropriate compression garments for long term management of lymphedema (flat knit 20-30 sleeve and glove, Tribute night, circular knit 30-40 sleeve and glove)    Time  4    Period  Weeks    Status  On-going            Plan - 04/20/18 0855    Clinical Impression Statement  Gave pt a copy of her signed prescription and gave her information to go get measured for compression garments. She is hoping to make an appointment on Wednesday prior to coming to her visit. Continued with MLD and bandaging and added extra tape to hand to hold bandages in place. Did not use grey foam today since pt did not have it.     Rehab Potential  Excellent    Clinical Impairments Affecting Rehab Potential  previous abdominal surgeries and breast surgeries     PT Frequency  3x / week    PT Duration  4 weeks    PT Treatment/Interventions  Manual lymph drainage;Compression bandaging;ADLs/Self Care Home Management;Therapeutic exercise;Therapeutic activities;Patient/family education;Orthotic Fit/Training;Manual techniques;Scar mobilization;Vasopneumatic Device;Taping;Passive range of motion    PT Next Visit Plan  see if pt made appt to get measured for garments, complete decongestive therapy including bandaging to Rt UE, once pt is maximally reduced she would benefit from a 20-30 flat knit sleeve and gauntlet and tribute night wrap for arm only    PT Home Exercise Plan  self MLD    Consulted and Agree with Plan of Care  Patient       Patient will benefit from skilled therapeutic intervention in order to improve the following deficits and impairments:  Increased edema, Decreased knowledge of precautions, Decreased range of motion, Decreased strength, Impaired UE functional use, Postural dysfunction, Decreased scar mobility, Pain  Visit Diagnosis: Postmastectomy lymphedema  Pain in right upper arm     Problem List Patient Active Problem  List  Diagnosis Date Noted  . Need for prophylactic vaccination against Streptococcus pneumoniae (pneumococcus) 01/05/2014  . Need for prophylactic vaccination and inoculation against influenza 09/12/2013  . Impacted cerumen of right ear 04/20/2013  . Eustachian tube dysfunction 04/20/2013  . Otitis, externa, infective 04/20/2013  . TMJ (temporomandibular joint disorder) 04/15/2013  . Unspecified vitamin D deficiency 04/15/2013  . Hyperlipidemia 04/15/2013  . Hip pain 03/03/2013  . Depression 03/03/2013  . Bursitis of hip 03/03/2013    Allyson Sabal [facility] 04/20/2018, 8:57 AM  [place], [place], [address] Phone: [phone]   Fax:  [phone]  Name: Holly Key MRN: [id number] Date of Birth: [birth year]  Manus Gunning, PT 04/20/18 8:57 AM

## 2018-04-23 ENCOUNTER — Ambulatory Visit: Payer: PPO | Admitting: Rehabilitation

## 2018-04-23 ENCOUNTER — Encounter: Payer: Self-pay | Admitting: Rehabilitation

## 2018-04-23 DIAGNOSIS — I972 Postmastectomy lymphedema syndrome: Secondary | ICD-10-CM | POA: Diagnosis not present

## 2018-04-23 DIAGNOSIS — M79621 Pain in right upper arm: Secondary | ICD-10-CM

## 2018-04-23 DIAGNOSIS — M6281 Muscle weakness (generalized): Secondary | ICD-10-CM

## 2018-04-23 DIAGNOSIS — M25611 Stiffness of right shoulder, not elsewhere classified: Secondary | ICD-10-CM

## 2018-04-23 NOTE — Therapy (Signed)
[place], [place], [address] Phone: [phone]   Fax:  [phone]  Physical Therapy Treatment  Patient Details  Name: Holly Key MRN: [id number] Date of Birth: 1951-03-15 Referring Provider: Wendee Beavers   Encounter Date: 04/23/2018  PT End of Session - 04/23/18 1252    Visit Number  17    Number of Visits  25    Date for PT Re-Evaluation  05/04/18    PT Start Time  1104    PT Stop Time  1155    PT Time Calculation (min)  51 min    Activity Tolerance  Patient tolerated treatment well    Behavior During Therapy  [facility] for tasks assessed/performed       Past Medical History:  Diagnosis Date  . Anxiety   . Atrial premature complex   . Breast cancer [facility]) 2003   Right Breast   . Colon polyp   . Depression   . GERD (gastroesophageal reflux disease)   . Pneumonia     Past Surgical History:  Procedure Laterality Date  . BREAST RECONSTRUCTION Right 2004  . GASTRIC FUNDOPLICATION    . MASTECTOMY Right 2003    There were no vitals filed for this visit.  Subjective Assessment - 04/23/18 1105    Subjective  Will be getting measured 10am wednesday      Pertinent History  2003 right mastectomy 5 sentinel nodes removed; then 6 rounds of chemo, no radiation.  Had been doing fine but a year and a half ago, went bowling.  After that had pain in right elbow and shoulder, then axilla, and then swelling that started proximal and moved down into the hand.  Both pain and swelling have worsened.  HHad breast reconstruction in 2004 with implant.  Had two basal cell skin cancers on chin and neck this year.  Blood pressure elevates to around 140 when she is in pain.  Two years ago had pneumonia and was hospitalized for 5 days.      Currently in Pain?  No/denies            LYMPHEDEMA/ONCOLOGY QUESTIONNAIRE - 04/23/18 1108      Right Upper Extremity Lymphedema   15 cm Proximal to Olecranon Process  39.3  cm    Olecranon Process  30.5 cm    15 cm Proximal to Ulnar Styloid Process  29.2 cm    Just Proximal to Ulnar Styloid Process  18.3 cm    Across Hand at [employer]  18.5 cm    At [place] of 2nd Digit  6 cm           [facility]  Lymphedema Life Impact Scale Total Score  30.88 %           [facility] Adult PT Treatment/Exercise - 04/23/18 0001      Manual Therapy   Manual therapy comments  arrived with some increased edema at the thumb without wearing the thumb last time     Edema Management  pt will get compression sleeve and gauntlet through a special place with alight funds and will either get the tribute night online or through [place] if the price is too much.      Manual Lymphatic Drainage (MLD)  short neck, superficial and deep abdominals, left axillary nodes and establishment of interaxillary pathway, right inguinal nodes and establishment of right axillo inguinal pathway, RUE working proximal to distal then retracing  all steps then to left sidelying to work on posterior back lymphedema     Compression Bandaging  lotion applied, thick stockinette, black foam to thumb to artiflex from hand to axilla, 10 cm Rosidal foam from wrist to axilla, 1 6cm, 1 8cm, 1 10cm and 1 12 cm from hand to axilla                  PT Long Term Goals - 04/06/18 1206      PT LONG TERM GOAL #1   Title  Pt will be independent in self manual lymphatic drainage for long term management of lymphedema    Time  4    Period  Weeks    Status  On-going      PT LONG TERM GOAL #2   Title  Pt will demonstrate a 5 cm decrease in lymphedema at 15 cm proximal to olecranon to decrease risk of cellulitis.     Baseline  45.5, 04/06/18- 40.1    Time  4    Period  Weeks    Status  Achieved      PT LONG TERM GOAL #3   Title  Pt will demonstrate 160 degrees of right shoulder flexion to allow her to reach items overhead    Baseline   149    Time  4    Period  Weeks    Status  On-going      PT LONG TERM GOAL #4   Title  Pt will demonstrate 160 degrees of right shoulder abduction to allow her to reach out to sides    Baseline  137    Time  4    Period  Weeks    Status  On-going      PT LONG TERM GOAL #5   Title  Pt will receive appropriate compression garments for long term management of lymphedema (flat knit 20-30 sleeve and glove, Tribute night, circular knit 30-40 sleeve and glove)    Time  4    Period  Weeks    Status  On-going            Plan - 04/23/18 1252    Clinical Impression Statement  Pt with no change in measurements from last time.  Ready for measurements into garments. Continued with MLD of the Rt UE and trunk today and rewrapped UE with extra tape for hand bandage.      PT Treatment/Interventions  Manual lymph drainage;Compression bandaging;ADLs/Self Care Home Management;Therapeutic exercise;Therapeutic activities;Patient/family education;Orthotic Fit/Training;Manual techniques;Scar mobilization;Vasopneumatic Device;Taping;Passive range of motion    PT Next Visit Plan  garment measure? continue R UE CDT       Patient will benefit from skilled therapeutic intervention in order to improve the following deficits and impairments:  Increased edema, Decreased knowledge of precautions, Decreased range of motion, Decreased strength, Impaired UE functional use, Postural dysfunction, Decreased scar mobility, Pain  Visit Diagnosis: Postmastectomy lymphedema  Pain in right upper arm  Stiffness of right shoulder, not elsewhere classified  Muscle weakness (generalized)     Problem List Patient Active Problem List   Diagnosis Date Noted  . Need for prophylactic vaccination against Streptococcus pneumoniae (pneumococcus) 01/05/2014  . Need for prophylactic vaccination and inoculation against influenza 09/12/2013  . Impacted cerumen of right ear 04/20/2013  . Eustachian tube dysfunction 04/20/2013   . Otitis, externa, infective 04/20/2013  . TMJ (temporomandibular joint disorder) 04/15/2013  . Unspecified vitamin D deficiency 04/15/2013  . Hyperlipidemia 04/15/2013  . Hip pain 03/03/2013  .  Depression 03/03/2013  . Bursitis of hip 03/03/2013    [name], PT 04/23/2018, 12:55 PM  [place], [place], [address] Phone: [phone]   Fax:  [phone]  Name: Holly Key MRN: [id number] Date of Birth: 03-Nov-1950

## 2018-04-25 ENCOUNTER — Ambulatory Visit: Payer: PPO

## 2018-04-25 DIAGNOSIS — I972 Postmastectomy lymphedema syndrome: Secondary | ICD-10-CM

## 2018-04-25 DIAGNOSIS — M79621 Pain in right upper arm: Secondary | ICD-10-CM

## 2018-04-25 NOTE — Therapy (Addendum)
[place], [place], [address] Phone: [phone]   Fax:  [phone]  Physical Therapy Treatment  Patient Details  Name: Holly Key MRN: [id number] Date of Birth: 1951/10/07 Referring Provider: Wendee Beavers   Encounter Date: 04/25/2018  PT End of Session - 04/25/18 1205    Visit Number  18    Number of Visits  25    Date for PT Re-Evaluation  05/04/18    PT Start Time  1111    PT Stop Time  1153    PT Time Calculation (min)  42 min    Activity Tolerance  Patient tolerated treatment well    Behavior During Therapy  [facility] for tasks assessed/performed       Past Medical History:  Diagnosis Date  . Anxiety   . Atrial premature complex   . Breast cancer [facility]) 2003   Right Breast   . Colon polyp   . Depression   . GERD (gastroesophageal reflux disease)   . Pneumonia     Past Surgical History:  Procedure Laterality Date  . BREAST RECONSTRUCTION Right 2004  . GASTRIC FUNDOPLICATION    . MASTECTOMY Right 2003    There were no vitals filed for this visit.  Subjective Assessment - 04/25/18 1113    Subjective  I got measured for my flat knit this morning and that went well. And I'll get measured here for my Tribute on Monday.    Pertinent History  2003 right mastectomy 5 sentinel nodes removed; then 6 rounds of chemo, no radiation.  Had been doing fine but a year and a half ago, went bowling.  After that had pain in right elbow and shoulder, then axilla, and then swelling that started proximal and moved down into the hand.  Both pain and swelling have worsened.  HHad breast reconstruction in 2004 with implant.  Had two basal cell skin cancers on chin and neck this year.  Blood pressure elevates to around 140 when she is in pain.  Two years ago had pneumonia and was hospitalized for 5 days.      Patient Stated Goals  relief of pain and swelling    Currently in Pain?  No/denies                   Outpatient [facility]09/[facility]  Lymphedema Life Impact Scale Total Score  30.88 %           [facility] Adult PT Treatment/Exercise - 04/25/18 0001      Manual Therapy   Manual Lymphatic Drainage (MLD)  short neck, superficial and deep abdominals, left axillary nodes and establishment of interaxillary pathway, right inguinal nodes and establishment of right axillo inguinal pathway, RUE working proximal to distal then retracing all steps then to left sidelying to work on posterior back lymphedema     Compression Bandaging  lotion applied, thick stockinette, black foam to thumb with Elastomull to fingers 1-4;  artiflex x1 from hand to axilla, 1-6cm, 1-8cm, 1-10cm (herring bone fashion) and 1-12 cm from hand to axilla                  PT Long Term Goals - 04/06/18 1206      PT LONG TERM GOAL #1   Title  Pt will be independent in self manual lymphatic drainage for long term management of lymphedema    Time  4    Period  Weeks  Status  On-going      PT LONG TERM GOAL #2   Title  Pt will demonstrate a 5 cm decrease in lymphedema at 15 cm proximal to olecranon to decrease risk of cellulitis.     Baseline  45.5, 04/06/18- 40.1    Time  4    Period  Weeks    Status  Achieved      PT LONG TERM GOAL #3   Title  Pt will demonstrate 160 degrees of right shoulder flexion to allow her to reach items overhead    Baseline  149    Time  4    Period  Weeks    Status  On-going      PT LONG TERM GOAL #4   Title  Pt will demonstrate 160 degrees of right shoulder abduction to allow her to reach out to sides    Baseline  137    Time  4    Period  Weeks    Status  On-going      PT LONG TERM GOAL #5   Title  Pt will receive appropriate compression garments for long term management of lymphedema (flat knit 20-30 sleeve and glove, Tribute night, circular knit 30-40 sleeve and glove)    Time  4    Period  Weeks     Status  On-going            Plan - 04/25/18 1207    Clinical Impression Statement  Pt came in with bandages off from being measured for her flat knit compression garments right before she came here today. She is scheduled to get measured for a Tribute garment by [employer] on Monday. She was quoted a Technical sales engineer for ~$1800 from [place] so is interested to see if [employer] has a better price point for this. If not she is going to look into compression shirts. Continuing with complete decongestive therapy until garments arrive.     Rehab Potential  Excellent    Clinical Impairments Affecting Rehab Potential  previous abdominal surgeries and breast surgeries     PT Frequency  3x / week    PT Duration  4 weeks    PT Treatment/Interventions  Manual lymph drainage;Compression bandaging;ADLs/Self Care Home Management;Therapeutic exercise;Therapeutic activities;Patient/family education;Orthotic Fit/Training;Manual techniques;Scar mobilization;Vasopneumatic Device;Taping;Passive range of motion    PT Next Visit Plan  Continue Rt UE CDT until garments arrive    Consulted and Agree with Plan of Care  Patient       Patient will benefit from skilled therapeutic intervention in order to improve the following deficits and impairments:  Increased edema, Decreased knowledge of precautions, Decreased range of motion, Decreased strength, Impaired UE functional use, Postural dysfunction, Decreased scar mobility, Pain  Visit Diagnosis: Postmastectomy lymphedema  Pain in right upper arm     Problem List Patient Active Problem List   Diagnosis Date Noted  . Need for prophylactic vaccination against Streptococcus pneumoniae (pneumococcus) 01/05/2014  . Need for prophylactic vaccination and inoculation against influenza 09/12/2013  . Impacted cerumen of right ear 04/20/2013  . Eustachian tube dysfunction 04/20/2013  . Otitis, externa, infective 04/20/2013  . TMJ (temporomandibular joint disorder)  04/15/2013  . Unspecified vitamin D deficiency 04/15/2013  . Hyperlipidemia 04/15/2013  . Hip pain 03/03/2013  . Depression 03/03/2013  . Bursitis of hip 03/03/2013    Otelia Limes, PTA 04/25/2018, 12:16 PM  [place], [place], [address] Phone: [phone]   Fax:  [phone]  Name: Holly Key MRN: [id number] Date of Birth: 1951-01-27

## 2018-04-27 ENCOUNTER — Encounter: Payer: Self-pay | Admitting: Rehabilitation

## 2018-04-27 ENCOUNTER — Ambulatory Visit: Payer: PPO | Admitting: Rehabilitation

## 2018-04-27 DIAGNOSIS — I972 Postmastectomy lymphedema syndrome: Secondary | ICD-10-CM | POA: Diagnosis not present

## 2018-04-27 DIAGNOSIS — M79621 Pain in right upper arm: Secondary | ICD-10-CM

## 2018-04-27 NOTE — Therapy (Signed)
[place], [place], [address] Phone: [phone]   Fax:  [phone]  Physical Therapy Treatment  Patient Details  Name: Holly Key MRN: [id number] Date of Birth: [birth year] Referring Provider: Wendee Beavers   Encounter Date: 04/27/2018  PT End of Session - 04/27/18 1154    Visit Number  19    Number of Visits  25    Date for PT Re-Evaluation  05/04/18    PT Start Time  0930    PT Stop Time  1015    PT Time Calculation (min)  45 min    Activity Tolerance  Patient tolerated treatment well    Behavior During Therapy  [facility] for tasks assessed/performed       Past Medical History:  Diagnosis Date  . Anxiety   . Atrial premature complex   . Breast cancer [facility]) 2003   Right Breast   . Colon polyp   . Depression   . GERD (gastroesophageal reflux disease)   . Pneumonia     Past Surgical History:  Procedure Laterality Date  . BREAST RECONSTRUCTION Right 2004  . GASTRIC FUNDOPLICATION    . MASTECTOMY Right 2003    There were no vitals filed for this visit.  Subjective Assessment - 04/27/18 1148    Subjective  I am sort of frustrated today.  A [facility] place called me to tell me that they lost my finger measurements and then I went this morning and no body showed up.  I will probably get a tribute from a special place if it is so expensive through [facility]      Pertinent History  2003 right mastectomy 5 sentinel nodes removed; then 6 rounds of chemo, no radiation.  Had been doing fine but a year and a half ago, went bowling.  After that had pain in right elbow and shoulder, then axilla, and then swelling that started proximal and moved down into the hand.  Both pain and swelling have worsened.  HHad breast reconstruction in 2004 with implant.  Had two basal cell skin cancers on chin and neck this year.  Blood pressure elevates to around 140 when she is in pain.  Two years ago had pneumonia and was  hospitalized for 5 days.      Currently in Pain?  No/denies                  Outpatient Re[facility] from [facility]09/[facility]  Lymphedema Life Impact Scale Total Score  30.88 %           [facility] Adult PT Treatment/Exercise - 04/27/18 0001      Manual Therapy   Edema Management  decided with pt to measure her for a gauntlet using the [employer] measurement forms to send to a special place to see if this will work.  She may or may not meet with [facility] Monday regarding the tribute night.      Manual Lymphatic Drainage (MLD)  not done today due to measurements taken for gauntlet    Compression Bandaging  lotion applied, thick stockinette, black foam to thumb with Elastomull to fingers 1-4;  artiflex x1 from hand to axilla, then rosidal from wrist to axilla, then 1-6cm hand bandage with extra tape, 1-8cm, 1-10cm                   PT Long Term Goals - 04/27/18 1157      PT LONG TERM  GOAL #1   Title  Pt will be independent in self manual lymphatic drainage for long term management of lymphedema    Status  On-going      PT LONG TERM GOAL #2   Title  Pt will demonstrate a 5 cm decrease in lymphedema at 15 cm proximal to olecranon to decrease risk of cellulitis.     Status  Achieved      PT LONG TERM GOAL #3   Title  Pt will demonstrate 160 degrees of right shoulder flexion to allow her to reach items overhead    Status  On-going      PT LONG TERM GOAL #4   Title  Pt will demonstrate 160 degrees of right shoulder abduction to allow her to reach out to sides    Status  On-going      PT LONG TERM GOAL #5   Title  Pt will receive appropriate compression garments for long term management of lymphedema (flat knit 20-30 sleeve and glove, Tribute night, circular knit 30-40 sleeve and glove)    Status  On-going            Plan - 04/27/18 1155    Clinical Impression Statement  Pt arrives with bandages off frustrated about  measurements being lost at a special place.  Performed gauntlet measurements for patient today and faxed to [facility] to see if these are good enough for use.  Otherwise she has to go back  She may get a tribute through a special place due to cost difference of almost 300 compared to [facility]    PT Frequency  3x / week    PT Duration  4 weeks    PT Treatment/Interventions  Manual lymph drainage;Compression bandaging;ADLs/Self Care Home Management;Therapeutic exercise;Therapeutic activities;Patient/family education;Orthotic Fit/Training;Manual techniques;Scar mobilization;Vasopneumatic Device;Taping;Passive range of motion    PT Next Visit Plan  Continue Rt UE CDT until garments arrive, hear from special place if these were ok? does she want to meet with Melissa on Monday or just skip?       Patient will benefit from skilled therapeutic intervention in order to improve the following deficits and impairments:  Increased edema, Decreased knowledge of precautions, Decreased range of motion, Decreased strength, Impaired UE functional use, Postural dysfunction, Decreased scar mobility, Pain  Visit Diagnosis: Postmastectomy lymphedema  Pain in right upper arm     Problem List Patient Active Problem List   Diagnosis Date Noted  . Need for prophylactic vaccination against Streptococcus pneumoniae (pneumococcus) 01/05/2014  . Need for prophylactic vaccination and inoculation against influenza 09/12/2013  . Impacted cerumen of right ear 04/20/2013  . Eustachian tube dysfunction 04/20/2013  . Otitis, externa, infective 04/20/2013  . TMJ (temporomandibular joint disorder) 04/15/2013  . Unspecified vitamin D deficiency 04/15/2013  . Hyperlipidemia 04/15/2013  . Hip pain 03/03/2013  . Depression 03/03/2013  . Bursitis of hip 03/03/2013    [name], PT 04/27/2018, 11:58 AM  [place], [place], [address] Phone:  [phone]   Fax:  [phone]  Name: Holly Key MRN: [id number] Date of Birth: Jul 08, 1951

## 2018-04-30 ENCOUNTER — Ambulatory Visit: Payer: PPO | Admitting: Rehabilitation

## 2018-04-30 ENCOUNTER — Encounter: Payer: Self-pay | Admitting: Rehabilitation

## 2018-04-30 DIAGNOSIS — M79621 Pain in right upper arm: Secondary | ICD-10-CM

## 2018-04-30 DIAGNOSIS — M25611 Stiffness of right shoulder, not elsewhere classified: Secondary | ICD-10-CM

## 2018-04-30 DIAGNOSIS — I972 Postmastectomy lymphedema syndrome: Secondary | ICD-10-CM

## 2018-04-30 DIAGNOSIS — M6281 Muscle weakness (generalized): Secondary | ICD-10-CM

## 2018-04-30 NOTE — Therapy (Signed)
[place], [place], [address] Phone: [phone]   Fax:  [phone]  Physical Therapy Treatment  Patient Details  Name: Holly Key MRN: [id number] Date of Birth: Sep 20, 1951 Referring Provider: Wendee Beavers   Encounter Date: 04/30/2018  PT End of Session - 04/30/18 1155    Visit Number  20    Number of Visits  25    Date for PT Re-Evaluation  05/04/18    PT Start Time  1100    PT Stop Time  1145    PT Time Calculation (min)  45 min    Activity Tolerance  Patient tolerated treatment well    Behavior During Therapy  [facility] for tasks assessed/performed       Past Medical History:  Diagnosis Date  . Anxiety   . Atrial premature complex   . Breast cancer [facility]) 2003   Right Breast   . Colon polyp   . Depression   . GERD (gastroesophageal reflux disease)   . Pneumonia     Past Surgical History:  Procedure Laterality Date  . BREAST RECONSTRUCTION Right 2004  . GASTRIC FUNDOPLICATION    . MASTECTOMY Right 2003    There were no vitals filed for this visit.  Subjective Assessment - 04/30/18 1151    Subjective  A special place found my measurements and were able to use that to order all of the garments    Pertinent History  2003 right mastectomy 5 sentinel nodes removed; then 6 rounds of chemo, no radiation.  Had been doing fine but a year and a half ago, went bowling.  After that had pain in right elbow and shoulder, then axilla, and then swelling that started proximal and moved down into the hand.  Both pain and swelling have worsened.  HHad breast reconstruction in 2004 with implant.  Had two basal cell skin cancers on chin and neck this year.  Blood pressure elevates to around 140 when she is in pain.  Two years ago had pneumonia and was hospitalized for 5 days.      Currently in Pain?  No/denies                  Outpatient [facility]  Lymphedema Life Impact Scale Total Score  30.88 %           [facility] Adult PT Treatment/Exercise - 04/30/18 0001      Manual Therapy   Edema Management  some increased redness at the elbow crease today.  improved with MLD and after lotion    Manual Lymphatic Drainage (MLD)  short neck, superficial and deep abdominals, left axillary nodes and establishment of interaxillary pathway, right inguinal nodes and establishment of right axillo inguinal pathway, RUE working proximal to distal then retracing all steps then to left sidelying to work on posterior back lymphedema then back to supine finishing with pathways    Compression Bandaging  no rosidal used today due to skin redness at the elbow and a bit at the forearm from constant compression.  lotion applied, thick stockinette, artiflex from hand to axilla doubled at the elbow, black foan hand piece.  1 6cm, 1 8cm in herringbone pattern, 1 10cm, and 1 12 cm                   PT Long Term Goals - 04/27/18 1157      PT LONG TERM GOAL #1   Title  Pt will be independent in self manual lymphatic drainage for long term management of lymphedema    Status  On-going      PT LONG TERM GOAL #2   Title  Pt will demonstrate a 5 cm decrease in lymphedema at 15 cm proximal to olecranon to decrease risk of cellulitis.     Status  Achieved      PT LONG TERM GOAL #3   Title  Pt will demonstrate 160 degrees of right shoulder flexion to allow her to reach items overhead    Status  On-going      PT LONG TERM GOAL #4   Title  Pt will demonstrate 160 degrees of right shoulder abduction to allow her to reach out to sides    Status  On-going      PT LONG TERM GOAL #5   Title  Pt will receive appropriate compression garments for long term management of lymphedema (flat knit 20-30 sleeve and glove, Tribute night, circular knit 30-40 sleeve and glove)    Status  On-going            Plan - 04/30/18 1156    Clinical Impression  Statement  Some increased redness in the elbow crease and slightly at the wrist.  Bandaged without foam and with 4 bandages today with extra padding over elbow crease and bandaged a bit lighter.  Has all garments measured    PT Treatment/Interventions  Manual lymph drainage;Compression bandaging;ADLs/Self Care Home Management;Therapeutic exercise;Therapeutic activities;Patient/family education;Orthotic Fit/Training;Manual techniques;Scar mobilization;Vasopneumatic Device;Taping;Passive range of motion    PT Next Visit Plan  Continue Rt UE CDT until garments arrive       Patient will benefit from skilled therapeutic intervention in order to improve the following deficits and impairments:  Increased edema, Decreased knowledge of precautions, Decreased range of motion, Decreased strength, Impaired UE functional use, Postural dysfunction, Decreased scar mobility, Pain  Visit Diagnosis: Postmastectomy lymphedema  Pain in right upper arm  Stiffness of right shoulder, not elsewhere classified  Muscle weakness (generalized)     Problem List Patient Active Problem List   Diagnosis Date Noted  . Need for prophylactic vaccination against Streptococcus pneumoniae (pneumococcus) 01/05/2014  . Need for prophylactic vaccination and inoculation against influenza 09/12/2013  . Impacted cerumen of right ear 04/20/2013  . Eustachian tube dysfunction 04/20/2013  . Otitis, externa, infective 04/20/2013  . TMJ (temporomandibular joint disorder) 04/15/2013  . Unspecified vitamin D deficiency 04/15/2013  . Hyperlipidemia 04/15/2013  . Hip pain 03/03/2013  . Depression 03/03/2013  . Bursitis of hip 03/03/2013    [name], PT 04/30/2018, 11:58 AM  [place], [place], [address] Phone: [phone]   Fax:  [phone]  Name: Holly Key MRN: [id number] Date of Birth: Jan 25, 1951

## 2018-05-02 ENCOUNTER — Ambulatory Visit: Payer: PPO

## 2018-05-02 DIAGNOSIS — I972 Postmastectomy lymphedema syndrome: Secondary | ICD-10-CM

## 2018-05-02 DIAGNOSIS — M79621 Pain in right upper arm: Secondary | ICD-10-CM

## 2018-05-02 DIAGNOSIS — M25611 Stiffness of right shoulder, not elsewhere classified: Secondary | ICD-10-CM

## 2018-05-02 DIAGNOSIS — M6281 Muscle weakness (generalized): Secondary | ICD-10-CM

## 2018-05-02 NOTE — Therapy (Signed)
[place], [place], [address] Phone: [phone]   Fax:  [phone]  Physical Therapy Treatment  Patient Details  Name: Holly Key MRN: [id number] Date of Birth: [birth year] Referring Provider: Wendee Beavers   Encounter Date: 05/02/2018  PT End of Session - 05/02/18 1153    Visit Number  21    Number of Visits  25    Date for PT Re-Evaluation  05/04/18    PT Start Time  1104    PT Stop Time  1150    PT Time Calculation (min)  46 min    Activity Tolerance  Patient tolerated treatment well    Behavior During Therapy  [facility] for tasks assessed/performed       Past Medical History:  Diagnosis Date  . Anxiety   . Atrial premature complex   . Breast cancer [facility]) 2003   Right Breast   . Colon polyp   . Depression   . GERD (gastroesophageal reflux disease)   . Pneumonia     Past Surgical History:  Procedure Laterality Date  . BREAST RECONSTRUCTION Right 2004  . GASTRIC FUNDOPLICATION    . MASTECTOMY Right 2003    There were no vitals filed for this visit.  Subjective Assessment - 05/02/18 1106    Subjective  Leaving the foam off really helped my shoulder to feel better so I want to keep that off.    Pertinent History  2003 right mastectomy 5 sentinel nodes removed; then 6 rounds of chemo, no radiation.  Had been doing fine but a year and a half ago, went bowling.  After that had pain in right elbow and shoulder, then axilla, and then swelling that started proximal and moved down into the hand.  Both pain and swelling have worsened.  HHad breast reconstruction in 2004 with implant.  Had two basal cell skin cancers on chin and neck this year.  Blood pressure elevates to around 140 when she is in pain.  Two years ago had pneumonia and was hospitalized for 5 days.      Patient Stated Goals  relief of pain and swelling    Currently in Pain?  No/denies            LYMPHEDEMA/ONCOLOGY QUESTIONNAIRE  - 05/02/18 1107      Right Upper Extremity Lymphedema   15 cm Proximal to Olecranon Process  38.2 cm    Olecranon Process  29.9 cm    15 cm Proximal to Ulnar Styloid Process  29.2 cm    Just Proximal to Ulnar Styloid Process  18.1 cm    Across Hand at [employer]  18 cm    At [place] of 2nd Digit  5.9 cm           [facility]patient Re[facility]09/[facility]  Lymphedema Life Impact Scale Total Score  30.88 %           [facility] Adult PT Treatment/Exercise - 05/02/18 0001      Manual Therapy   Manual Lymphatic Drainage (MLD)  short neck, superficial and deep abdominals, left axillary nodes and establishment of interaxillary pathway, right inguinal nodes and establishment of right axillo inguinal pathway, RUE working proximal to distal then retracing all steps then to left sidelying to work on posterior back lymphedema then back to supine finishing with pathways    Compression Bandaging  Cont with no rosidal used today due to skin redness at the elbow and  a bit at the forearm from constant compression.  lotion applied, thick stockinette, artiflex from hand to axilla doubled at the elbow, black foan hand piece.  1 6cm, 1 8cm, 1 10cm in herring bone pattern, and 1 12 cm                   PT Long Term Goals - 04/27/18 1157      PT LONG TERM GOAL #1   Title  Pt will be independent in self manual lymphatic drainage for long term management of lymphedema    Status  On-going      PT LONG TERM GOAL #2   Title  Pt will demonstrate a 5 cm decrease in lymphedema at 15 cm proximal to olecranon to decrease risk of cellulitis.     Status  Achieved      PT LONG TERM GOAL #3   Title  Pt will demonstrate 160 degrees of right shoulder flexion to allow her to reach items overhead    Status  On-going      PT LONG TERM GOAL #4   Title  Pt will demonstrate 160 degrees of right shoulder abduction to allow her to reach out to sides    Status  On-going       PT LONG TERM GOAL #5   Title  Pt will receive appropriate compression garments for long term management of lymphedema (flat knit 20-30 sleeve and glove, Tribute night, circular knit 30-40 sleeve and glove)    Status  On-going            Plan - 05/02/18 1154    Clinical Impression Statement  Pts redness was improved at Rt UE but she reported her arm felt much better without Rosidal foam so continued without this today. Continued with herring bone pattern with one of bandages for increased compression which pt seems to be tolerating well. Her circumference measurements continue to improve. She has already been measured for compression garments which should arrive in next week or so. Pt will be ready for D/C at that time.     Rehab Potential  Excellent    Clinical Impairments Affecting Rehab Potential  previous abdominal surgeries and breast surgeries     PT Frequency  3x / week    PT Duration  4 weeks    PT Treatment/Interventions  Manual lymph drainage;Compression bandaging;ADLs/Self Care Home Management;Therapeutic exercise;Therapeutic activities;Patient/family education;Orthotic Fit/Training;Manual techniques;Scar mobilization;Vasopneumatic Device;Taping;Passive range of motion    PT Next Visit Plan  Continue Rt UE CDT until garments arrive    Consulted and Agree with Plan of Care  Patient       Patient will benefit from skilled therapeutic intervention in order to improve the following deficits and impairments:  Increased edema, Decreased knowledge of precautions, Decreased range of motion, Decreased strength, Impaired UE functional use, Postural dysfunction, Decreased scar mobility, Pain  Visit Diagnosis: Postmastectomy lymphedema  Pain in right upper arm  Stiffness of right shoulder, not elsewhere classified  Muscle weakness (generalized)     Problem List Patient Active Problem List   Diagnosis Date Noted  . Need for prophylactic vaccination against Streptococcus  pneumoniae (pneumococcus) 01/05/2014  . Need for prophylactic vaccination and inoculation against influenza 09/12/2013  . Impacted cerumen of right ear 04/20/2013  . Eustachian tube dysfunction 04/20/2013  . Otitis, externa, infective 04/20/2013  . TMJ (temporomandibular joint disorder) 04/15/2013  . Unspecified vitamin D deficiency 04/15/2013  . Hyperlipidemia 04/15/2013  . Hip pain 03/03/2013  . Depression  03/03/2013  . Bursitis of hip 03/03/2013    Otelia Limes, PTA 05/02/2018, 11:59 AM  [place], [place], [address] Phone: [phone]   Fax:  [phone]  Name: WENDIE DISKIN MRN: [id number] Date of Birth: July 26, 1951

## 2018-05-04 ENCOUNTER — Ambulatory Visit: Payer: PPO | Admitting: Rehabilitation

## 2018-05-04 ENCOUNTER — Encounter: Payer: Self-pay | Admitting: Rehabilitation

## 2018-05-04 DIAGNOSIS — I972 Postmastectomy lymphedema syndrome: Secondary | ICD-10-CM

## 2018-05-04 DIAGNOSIS — M79621 Pain in right upper arm: Secondary | ICD-10-CM

## 2018-05-04 NOTE — Therapy (Signed)
[place], [place], [address] Phone: [phone]   Fax:  [phone]  Physical Therapy Treatment  Patient Details  Name: Holly Key MRN: [id number] Date of Birth: 1951/05/02 Referring Provider: Wendee Beavers   Encounter Date: 05/04/2018  PT End of Session - 05/04/18 0853    Visit Number  21    Number of Visits  25    Date for PT Re-Evaluation  05/04/18    PT Start Time  0850    PT Stop Time  0930    PT Time Calculation (min)  40 min    Activity Tolerance  Patient tolerated treatment well    Behavior During Therapy  [facility] for tasks assessed/performed       Past Medical History:  Diagnosis Date  . Anxiety   . Atrial premature complex   . Breast cancer [facility]) 2003   Right Breast   . Colon polyp   . Depression   . GERD (gastroesophageal reflux disease)   . Pneumonia     Past Surgical History:  Procedure Laterality Date  . BREAST RECONSTRUCTION Right 2004  . GASTRIC FUNDOPLICATION    . MASTECTOMY Right 2003    There were no vitals filed for this visit.  Subjective Assessment - 05/04/18 0850    Subjective  Scratched the arm today but overall doing well.      Pertinent History  2003 right mastectomy 5 sentinel nodes removed; then 6 rounds of chemo, no radiation.  Had been doing fine but a year and a half ago, went bowling.  After that had pain in right elbow and shoulder, then axilla, and then swelling that started proximal and moved down into the hand.  Both pain and swelling have worsened.  HHad breast reconstruction in 2004 with implant.  Had two basal cell skin cancers on chin and neck this year.  Blood pressure elevates to around 140 when she is in pain.  Two years ago had pneumonia and was hospitalized for 5 days.      Currently in Pain?  No/denies                  Outpatient [facility] from [facility]/09/[facility]  Lymphedema Life Impact Scale Total  Score  30.88 %           [facility] Adult PT Treatment/Exercise - 05/04/18 0001      Manual Therapy   Manual Lymphatic Drainage (MLD)  short neck, superficial and deep abdominals, left axillary nodes and establishment of interaxillary pathway, right inguinal nodes and establishment of right axillo inguinal pathway, RUE working proximal to distal then retracing all steps then to left sidelying to work on posterior back lymphedema then back to supine finishing with pathways    Compression Bandaging  Cont with no rosidal used today due to skin redness at the elbow and a bit at the forearm from constant compression.  lotion applied, thick stockinette, artiflex from hand to axilla doubled at the elbow, black foan hand piece.  1 6cm, 1 8cm, 1 10cm in herring bone pattern, and 1 12 cm                   PT Long Term Goals - 05/04/18 1047      PT LONG TERM GOAL #1   Title  Pt will be independent in self manual lymphatic drainage for long term management of lymphedema    Status  On-going  PT LONG TERM GOAL #2   Title  Pt will demonstrate a 5 cm decrease in lymphedema at 15 cm proximal to olecranon to decrease risk of cellulitis.     Status  Achieved      PT LONG TERM GOAL #3   Title  Pt will demonstrate 160 degrees of right shoulder flexion to allow her to reach items overhead    Status  On-going      PT LONG TERM GOAL #4   Title  Pt will demonstrate 160 degrees of right shoulder abduction to allow her to reach out to sides    Status  On-going      PT LONG TERM GOAL #5   Title  Pt will receive appropriate compression garments for long term management of lymphedema (flat knit 20-30 sleeve and glove, Tribute night, circular knit 30-40 sleeve and glove)    Status  On-going            Plan - 05/04/18 1029    Clinical Impression Statement  No redness today at the elbow.  Still doing well without rosidal.  Elmininated foam hand piece and was able to completely wrap the arm  with 3 bandages.  Just waiting for garments    PT Treatment/Interventions  Manual lymph drainage;Compression bandaging;ADLs/Self Care Home Management;Therapeutic exercise;Therapeutic activities;Patient/family education;Orthotic Fit/Training;Manual techniques;Scar mobilization;Vasopneumatic Device;Taping;Passive range of motion    PT Next Visit Plan  Continue Rt UE CDT until garments arrive    PT Home Exercise Plan  self MLD       Patient will benefit from skilled therapeutic intervention in order to improve the following deficits and impairments:  Increased edema, Decreased knowledge of precautions, Decreased range of motion, Decreased strength, Impaired UE functional use, Postural dysfunction, Decreased scar mobility, Pain  Visit Diagnosis: Postmastectomy lymphedema  Pain in right upper arm     Problem List Patient Active Problem List   Diagnosis Date Noted  . Need for prophylactic vaccination against Streptococcus pneumoniae (pneumococcus) 01/05/2014  . Need for prophylactic vaccination and inoculation against influenza 09/12/2013  . Impacted cerumen of right ear 04/20/2013  . Eustachian tube dysfunction 04/20/2013  . Otitis, externa, infective 04/20/2013  . TMJ (temporomandibular joint disorder) 04/15/2013  . Unspecified vitamin D deficiency 04/15/2013  . Hyperlipidemia 04/15/2013  . Hip pain 03/03/2013  . Depression 03/03/2013  . Bursitis of hip 03/03/2013   [name], PT 05/04/2018, 10:48 AM  [place], [place], [address] Phone: [phone]   Fax:  [phone]  Name: Holly Key MRN: [id number] Date of Birth: [birth year]

## 2018-05-07 ENCOUNTER — Ambulatory Visit: Payer: PPO

## 2018-05-07 DIAGNOSIS — M79621 Pain in right upper arm: Secondary | ICD-10-CM

## 2018-05-07 DIAGNOSIS — I972 Postmastectomy lymphedema syndrome: Secondary | ICD-10-CM | POA: Diagnosis not present

## 2018-05-07 NOTE — Therapy (Signed)
[place], [place], [address] Phone: [phone]   Fax:  [phone]  Physical Therapy Treatment  Patient Details  Name: Holly Key MRN: [id number] Date of Birth: 1950-10-21 Referring Provider: Wendee Beavers   Encounter Date: 05/07/2018  PT End of Session - 05/07/18 1153    Visit Number  22    Number of Visits  25    Date for PT Re-Evaluation  05/04/18    PT Start Time  1106    PT Stop Time  1151    PT Time Calculation (min)  45 min    Activity Tolerance  Patient tolerated treatment well    Behavior During Therapy  [facility] for tasks assessed/performed       Past Medical History:  Diagnosis Date  . Anxiety   . Atrial premature complex   . Breast cancer [facility]) 2003   Right Breast   . Colon polyp   . Depression   . GERD (gastroesophageal reflux disease)   . Pneumonia     Past Surgical History:  Procedure Laterality Date  . BREAST RECONSTRUCTION Right 2004  . GASTRIC FUNDOPLICATION    . MASTECTOMY Right 2003    There were no vitals filed for this visit.  Subjective Assessment - 05/07/18 1110    Subjective  I just took bandages off this morning. They had slid down some but overall did okay. I already have my tribute and my daytime garment has arrived and I'll get that tomorrow.     Pertinent History  2003 right mastectomy 5 sentinel nodes removed; then 6 rounds of chemo, no radiation.  Had been doing fine but a year and a half ago, went bowling.  After that had pain in right elbow and shoulder, then axilla, and then swelling that started proximal and moved down into the hand.  Both pain and swelling have worsened.  HHad breast reconstruction in 2004 with implant.  Had two basal cell skin cancers on chin and neck this year.  Blood pressure elevates to around 140 when she is in pain.  Two years ago had pneumonia and was hospitalized for 5 days.      Patient Stated Goals  relief of pain and swelling    Currently in Pain?  No/denies                  Outpatient Re[facility] from [facility]09/[facility]  Lymphedema Life Impact Scale Total Score  30.88 %           [facility] Adult PT Treatment/Exercise - 05/07/18 0001      Manual Therapy   Manual Lymphatic Drainage (MLD)  short neck, superficial and deep abdominals, left axillary nodes and establishment of interaxillary pathway, right inguinal nodes and establishment of right axillo inguinal pathway, RUE working proximal to distal then retracing all steps then to left sidelying to work on posterior back lymphedema then back to supine finishing with pathways    Compression Bandaging  Cont with no rosidal used today;  lotion applied, thick stockinette, artiflex from hand to axilla doubled at the elbow, 1 6cm, 1 8cm, 1 10cm from hand to elbow                    PT Long Term Goals - 05/04/18 1047      PT LONG TERM GOAL #1   Title  Pt will be independent in self manual lymphatic drainage for long term management of lymphedema  Status  On-going      PT LONG TERM GOAL #2   Title  Pt will demonstrate a 5 cm decrease in lymphedema at 15 cm proximal to olecranon to decrease risk of cellulitis.     Status  Achieved      PT LONG TERM GOAL #3   Title  Pt will demonstrate 160 degrees of right shoulder flexion to allow her to reach items overhead    Status  On-going      PT LONG TERM GOAL #4   Title  Pt will demonstrate 160 degrees of right shoulder abduction to allow her to reach out to sides    Status  On-going      PT LONG TERM GOAL #5   Title  Pt will receive appropriate compression garments for long term management of lymphedema (flat knit 20-30 sleeve and glove, Tribute night, circular knit 30-40 sleeve and glove)    Status  On-going            Plan - 05/07/18 1159    Clinical Impression Statement  Pt removed bandages this morning right before appointment and other than indentation  at upper arm from where bandages had slid down, her arm looked really good. Continued with bandaging today. Her day time compression sleeve ahs arrived and she has an appointment to pick that up tomorrow morning so will wear bandages until then. Pt will make 1 more appointment so we can assess new garments and take final measurements.     Rehab Potential  Excellent    Clinical Impairments Affecting Rehab Potential  previous abdominal surgeries and breast surgeries     PT Frequency  3x / week    PT Duration  4 weeks    PT Treatment/Interventions  Manual lymph drainage;Compression bandaging;ADLs/Self Care Home Management;Therapeutic exercise;Therapeutic activities;Patient/family education;Orthotic Fit/Training;Manual techniques;Scar mobilization;Vasopneumatic Device;Taping;Passive range of motion    PT Next Visit Plan  Pt has one more visit for final assess of garments then D/C.    Consulted and Agree with Plan of Care  Patient       Patient will benefit from skilled therapeutic intervention in order to improve the following deficits and impairments:  Increased edema, Decreased knowledge of precautions, Decreased range of motion, Decreased strength, Impaired UE functional use, Postural dysfunction, Decreased scar mobility, Pain  Visit Diagnosis: Postmastectomy lymphedema  Pain in right upper arm     Problem List Patient Active Problem List   Diagnosis Date Noted  . Need for prophylactic vaccination against Streptococcus pneumoniae (pneumococcus) 01/05/2014  . Need for prophylactic vaccination and inoculation against influenza 09/12/2013  . Impacted cerumen of right ear 04/20/2013  . Eustachian tube dysfunction 04/20/2013  . Otitis, externa, infective 04/20/2013  . TMJ (temporomandibular joint disorder) 04/15/2013  . Unspecified vitamin D deficiency 04/15/2013  . Hyperlipidemia 04/15/2013  . Hip pain 03/03/2013  . Depression 03/03/2013  . Bursitis of hip 03/03/2013    Otelia Limes, PTA 05/07/2018, 12:05 PM  [place], [place], [address] Phone: [phone]   Fax:  [phone]  Name: Holly Key MRN: [id number] Date of Birth: January 14, 1951

## 2018-05-09 DIAGNOSIS — N950 Postmenopausal bleeding: Secondary | ICD-10-CM | POA: Diagnosis not present

## 2018-05-09 DIAGNOSIS — R9389 Abnormal findings on diagnostic imaging of other specified body structures: Secondary | ICD-10-CM | POA: Diagnosis not present

## 2018-05-11 ENCOUNTER — Encounter: Payer: Self-pay | Admitting: Rehabilitation

## 2018-05-11 ENCOUNTER — Ambulatory Visit: Payer: PPO | Attending: Hematology and Oncology | Admitting: Rehabilitation

## 2018-05-11 DIAGNOSIS — M25611 Stiffness of right shoulder, not elsewhere classified: Secondary | ICD-10-CM | POA: Insufficient documentation

## 2018-05-11 DIAGNOSIS — I972 Postmastectomy lymphedema syndrome: Secondary | ICD-10-CM

## 2018-05-11 DIAGNOSIS — M79621 Pain in right upper arm: Secondary | ICD-10-CM | POA: Diagnosis not present

## 2018-05-11 NOTE — Therapy (Signed)
[place], [place], [address] Phone: [phone]   Fax:  [phone]  Physical Therapy Treatment  Patient Details  Name: KANOELANI DOBIES MRN: [id number] Date of Birth: 24-Sep-1951 Referring Provider: Wendee Beavers   Encounter Date: 05/11/2018  PT End of Session - 05/11/18 0953    Visit Number  23    Number of Visits  25    Date for PT Re-Evaluation  05/04/18    PT Start Time  0930    PT Stop Time  0953    PT Time Calculation (min)  23 min    Activity Tolerance  Patient tolerated treatment well    Behavior During Therapy  [facility] for tasks assessed/performed       Past Medical History:  Diagnosis Date  . Anxiety   . Atrial premature complex   . Breast cancer [facility]) 2003   Right Breast   . Colon polyp   . Depression   . GERD (gastroesophageal reflux disease)   . Pneumonia     Past Surgical History:  Procedure Laterality Date  . BREAST RECONSTRUCTION Right 2004  . GASTRIC FUNDOPLICATION    . MASTECTOMY Right 2003    There were no vitals filed for this visit.  Subjective Assessment - 05/11/18 0951    Subjective  arrives wearing her new compression mondi esprit medi garment and gauntlet with no compaints    Pertinent History  2003 right mastectomy 5 sentinel nodes removed; then 6 rounds of chemo, no radiation.  Had been doing fine but a year and a half ago, went bowling.  After that had pain in right elbow and shoulder, then axilla, and then swelling that started proximal and moved down into the hand.  Both pain and swelling have worsened.  HHad breast reconstruction in 2004 with implant.  Had two basal cell skin cancers on chin and neck this year.  Blood pressure elevates to around 140 when she is in pain.  Two years ago had pneumonia and was hospitalized for 5 days.      Currently in Pain?  No/denies         [facility] PT Assessment - 05/11/18 0001      AROM   Right Shoulder Flexion  160 Degrees     Right Shoulder ABduction  135 Degrees              [facility]  Lymphedema Life Impact Scale Total Score  30.88 %           [facility] Adult PT Treatment/Exercise - 05/11/18 0001      Manual Therapy   Edema Management  showed pt how to make sure the garment is up in the axilla as high a possile and how to tuck extra tissue in.  Showed pt under armor and [employer] compression t shirts      Prosthetics   Education Provided  Proper Donning    Person(s) Educated  Patient    Education Method  Demonstration;Explanation                  PT Long Term Goals - 05/11/18 0936      PT LONG TERM GOAL #1   Title  Pt will be independent in self manual lymphatic drainage for long term management of lymphedema    Status  Achieved      PT LONG TERM GOAL #2   Title  Pt will demonstrate a  5 cm decrease in lymphedema at 15 cm proximal to olecranon to decrease risk of cellulitis.     Status  Achieved      PT LONG TERM GOAL #3   Title  Pt will demonstrate 160 degrees of right shoulder flexion to allow her to reach items overhead    Baseline  160    Status  Achieved      PT LONG TERM GOAL #4   Title  Pt will demonstrate 160 degrees of right shoulder abduction to allow her to reach out to sides    Status  Not Met      PT LONG TERM GOAL #5   Title  Pt will receive appropriate compression garments for long term management of lymphedema (flat knit 20-30 sleeve and glove, Tribute night, circular knit 30-40 sleeve and glove)    Status  Achieved            Plan - 05/11/18 0953    Clinical Impression Statement  Pt now independent with final care for lymphedema. Still waiting for the night garment.  Reviewed how to don the compression garment with instruction to get it higher in the axilla and to tuck in extra tissue.  Pt will start to wear the flexitouch more now not bandaged for the upper shoulder  edema and will get a compression tshirt    Consulted and Agree with Plan of Care  Patient       Patient will benefit from skilled therapeutic intervention in order to improve the following deficits and impairments:  Increased edema, Decreased knowledge of precautions, Decreased range of motion, Decreased strength, Impaired UE functional use, Postural dysfunction, Decreased scar mobility, Pain  Visit Diagnosis: Postmastectomy lymphedema  Pain in right upper arm  Stiffness of right shoulder, not elsewhere classified     Problem List Patient Active Problem List   Diagnosis Date Noted  . Need for prophylactic vaccination against Streptococcus pneumoniae (pneumococcus) 01/05/2014  . Need for prophylactic vaccination and inoculation against influenza 09/12/2013  . Impacted cerumen of right ear 04/20/2013  . Eustachian tube dysfunction 04/20/2013  . Otitis, externa, infective 04/20/2013  . TMJ (temporomandibular joint disorder) 04/15/2013  . Unspecified vitamin D deficiency 04/15/2013  . Hyperlipidemia 04/15/2013  . Hip pain 03/03/2013  . Depression 03/03/2013  . Bursitis of hip 03/03/2013    [name], PT 05/11/2018, 9:55 AM  [place], [place], [address] Phone: [phone]   Fax:  [phone]  Name: CRISTALLE ROHM MRN: [id number] Date of Birth: 10-Jan-1951 PHYSICAL THERAPY DISCHARGE SUMMARY  Visits from Start of Care: 23  Current functional level related to goals / functional outcomes: See above   Remaining deficits: R shoulder pain with active movement from humerus fracture and not    Education / Equipment: Got mondi esprit class I custom sleeve and gauntlet Plan: Patient agrees to discharge.  Patient goals were partially met. Patient is being discharged due to being pleased with the current functional level.  ?????

## 2018-05-24 DIAGNOSIS — Z01818 Encounter for other preprocedural examination: Secondary | ICD-10-CM | POA: Diagnosis not present

## 2018-05-24 DIAGNOSIS — R9389 Abnormal findings on diagnostic imaging of other specified body structures: Secondary | ICD-10-CM | POA: Diagnosis not present

## 2018-05-24 DIAGNOSIS — N950 Postmenopausal bleeding: Secondary | ICD-10-CM | POA: Diagnosis not present

## 2018-05-25 DIAGNOSIS — R9389 Abnormal findings on diagnostic imaging of other specified body structures: Secondary | ICD-10-CM | POA: Diagnosis not present

## 2018-05-25 DIAGNOSIS — N840 Polyp of corpus uteri: Secondary | ICD-10-CM | POA: Diagnosis not present

## 2018-05-25 DIAGNOSIS — N946 Dysmenorrhea, unspecified: Secondary | ICD-10-CM | POA: Diagnosis not present

## 2018-05-25 DIAGNOSIS — N950 Postmenopausal bleeding: Secondary | ICD-10-CM | POA: Diagnosis not present

## 2018-07-05 DIAGNOSIS — F329 Major depressive disorder, single episode, unspecified: Secondary | ICD-10-CM | POA: Diagnosis not present

## 2018-07-05 DIAGNOSIS — F419 Anxiety disorder, unspecified: Secondary | ICD-10-CM | POA: Diagnosis not present

## 2018-07-05 DIAGNOSIS — Z23 Encounter for immunization: Secondary | ICD-10-CM | POA: Diagnosis not present

## 2018-07-05 DIAGNOSIS — R609 Edema, unspecified: Secondary | ICD-10-CM | POA: Diagnosis not present

## 2018-07-25 DIAGNOSIS — M545 Low back pain: Secondary | ICD-10-CM | POA: Diagnosis not present

## 2018-07-25 DIAGNOSIS — S39012A Strain of muscle, fascia and tendon of lower back, initial encounter: Secondary | ICD-10-CM | POA: Diagnosis not present

## 2018-07-25 DIAGNOSIS — M5116 Intervertebral disc disorders with radiculopathy, lumbar region: Secondary | ICD-10-CM | POA: Diagnosis not present

## 2018-07-25 DIAGNOSIS — M898X8 Other specified disorders of bone, other site: Secondary | ICD-10-CM | POA: Diagnosis not present

## 2018-07-25 DIAGNOSIS — M5136 Other intervertebral disc degeneration, lumbar region: Secondary | ICD-10-CM | POA: Diagnosis not present

## 2018-07-31 DIAGNOSIS — Z853 Personal history of malignant neoplasm of breast: Secondary | ICD-10-CM | POA: Diagnosis not present

## 2018-07-31 DIAGNOSIS — M545 Low back pain: Secondary | ICD-10-CM | POA: Diagnosis not present

## 2018-07-31 DIAGNOSIS — M47897 Other spondylosis, lumbosacral region: Secondary | ICD-10-CM | POA: Diagnosis not present

## 2018-07-31 DIAGNOSIS — M5126 Other intervertebral disc displacement, lumbar region: Secondary | ICD-10-CM | POA: Diagnosis not present

## 2018-07-31 DIAGNOSIS — M48061 Spinal stenosis, lumbar region without neurogenic claudication: Secondary | ICD-10-CM | POA: Diagnosis not present

## 2018-08-06 DIAGNOSIS — M899 Disorder of bone, unspecified: Secondary | ICD-10-CM | POA: Diagnosis not present

## 2018-08-13 DIAGNOSIS — C50919 Malignant neoplasm of unspecified site of unspecified female breast: Secondary | ICD-10-CM | POA: Diagnosis not present

## 2018-08-13 DIAGNOSIS — R918 Other nonspecific abnormal finding of lung field: Secondary | ICD-10-CM | POA: Diagnosis not present

## 2018-08-13 DIAGNOSIS — M898X9 Other specified disorders of bone, unspecified site: Secondary | ICD-10-CM | POA: Diagnosis not present

## 2018-08-16 DIAGNOSIS — R590 Localized enlarged lymph nodes: Secondary | ICD-10-CM | POA: Diagnosis not present

## 2018-08-16 DIAGNOSIS — Z853 Personal history of malignant neoplasm of breast: Secondary | ICD-10-CM | POA: Diagnosis not present

## 2018-08-16 DIAGNOSIS — Z170 Estrogen receptor positive status [ER+]: Secondary | ICD-10-CM | POA: Diagnosis not present

## 2018-08-16 DIAGNOSIS — Z9221 Personal history of antineoplastic chemotherapy: Secondary | ICD-10-CM | POA: Diagnosis not present

## 2018-08-16 DIAGNOSIS — C50311 Malignant neoplasm of lower-inner quadrant of right female breast: Secondary | ICD-10-CM | POA: Diagnosis not present

## 2018-08-16 DIAGNOSIS — M899 Disorder of bone, unspecified: Secondary | ICD-10-CM | POA: Diagnosis not present

## 2018-08-16 DIAGNOSIS — C50611 Malignant neoplasm of axillary tail of right female breast: Secondary | ICD-10-CM | POA: Diagnosis not present

## 2018-08-20 DIAGNOSIS — R590 Localized enlarged lymph nodes: Secondary | ICD-10-CM | POA: Diagnosis not present

## 2018-08-20 DIAGNOSIS — Z853 Personal history of malignant neoplasm of breast: Secondary | ICD-10-CM | POA: Diagnosis not present

## 2018-08-22 DIAGNOSIS — R590 Localized enlarged lymph nodes: Secondary | ICD-10-CM | POA: Diagnosis not present

## 2018-08-27 DIAGNOSIS — Z170 Estrogen receptor positive status [ER+]: Secondary | ICD-10-CM | POA: Diagnosis not present

## 2018-08-27 DIAGNOSIS — C773 Secondary and unspecified malignant neoplasm of axilla and upper limb lymph nodes: Secondary | ICD-10-CM | POA: Diagnosis not present

## 2018-08-27 DIAGNOSIS — R51 Headache: Secondary | ICD-10-CM | POA: Diagnosis not present

## 2018-08-27 DIAGNOSIS — C50919 Malignant neoplasm of unspecified site of unspecified female breast: Secondary | ICD-10-CM | POA: Diagnosis not present

## 2018-08-27 DIAGNOSIS — C7951 Secondary malignant neoplasm of bone: Secondary | ICD-10-CM | POA: Diagnosis not present

## 2018-08-27 DIAGNOSIS — C50311 Malignant neoplasm of lower-inner quadrant of right female breast: Secondary | ICD-10-CM | POA: Diagnosis not present

## 2018-08-27 DIAGNOSIS — Z9221 Personal history of antineoplastic chemotherapy: Secondary | ICD-10-CM | POA: Diagnosis not present

## 2018-08-29 DIAGNOSIS — R51 Headache: Secondary | ICD-10-CM | POA: Diagnosis not present

## 2018-08-29 DIAGNOSIS — C50919 Malignant neoplasm of unspecified site of unspecified female breast: Secondary | ICD-10-CM | POA: Diagnosis not present

## 2018-09-03 DIAGNOSIS — C7951 Secondary malignant neoplasm of bone: Secondary | ICD-10-CM | POA: Diagnosis not present

## 2018-09-03 DIAGNOSIS — Z9221 Personal history of antineoplastic chemotherapy: Secondary | ICD-10-CM | POA: Diagnosis not present

## 2018-09-03 DIAGNOSIS — Z170 Estrogen receptor positive status [ER+]: Secondary | ICD-10-CM | POA: Diagnosis not present

## 2018-09-03 DIAGNOSIS — C50311 Malignant neoplasm of lower-inner quadrant of right female breast: Secondary | ICD-10-CM | POA: Diagnosis not present

## 2018-09-18 DIAGNOSIS — Z170 Estrogen receptor positive status [ER+]: Secondary | ICD-10-CM | POA: Diagnosis not present

## 2018-09-18 DIAGNOSIS — C7951 Secondary malignant neoplasm of bone: Secondary | ICD-10-CM | POA: Diagnosis not present

## 2018-09-18 DIAGNOSIS — C50911 Malignant neoplasm of unspecified site of right female breast: Secondary | ICD-10-CM | POA: Diagnosis not present

## 2018-09-18 DIAGNOSIS — Z9221 Personal history of antineoplastic chemotherapy: Secondary | ICD-10-CM | POA: Diagnosis not present

## 2018-09-18 DIAGNOSIS — Z9011 Acquired absence of right breast and nipple: Secondary | ICD-10-CM | POA: Diagnosis not present

## 2018-09-18 DIAGNOSIS — C50311 Malignant neoplasm of lower-inner quadrant of right female breast: Secondary | ICD-10-CM | POA: Diagnosis not present

## 2018-09-18 DIAGNOSIS — Z79899 Other long term (current) drug therapy: Secondary | ICD-10-CM | POA: Diagnosis not present

## 2018-09-21 DIAGNOSIS — C7951 Secondary malignant neoplasm of bone: Secondary | ICD-10-CM | POA: Diagnosis not present

## 2018-09-21 DIAGNOSIS — Z510 Encounter for antineoplastic radiation therapy: Secondary | ICD-10-CM | POA: Diagnosis not present

## 2018-09-21 DIAGNOSIS — C50919 Malignant neoplasm of unspecified site of unspecified female breast: Secondary | ICD-10-CM | POA: Diagnosis not present

## 2018-09-25 DIAGNOSIS — C7951 Secondary malignant neoplasm of bone: Secondary | ICD-10-CM | POA: Diagnosis not present

## 2018-09-27 DIAGNOSIS — C50919 Malignant neoplasm of unspecified site of unspecified female breast: Secondary | ICD-10-CM | POA: Diagnosis not present

## 2018-09-27 DIAGNOSIS — C7951 Secondary malignant neoplasm of bone: Secondary | ICD-10-CM | POA: Diagnosis not present

## 2018-09-27 DIAGNOSIS — Z510 Encounter for antineoplastic radiation therapy: Secondary | ICD-10-CM | POA: Diagnosis not present

## 2018-10-05 DIAGNOSIS — Z510 Encounter for antineoplastic radiation therapy: Secondary | ICD-10-CM | POA: Diagnosis not present

## 2018-10-05 DIAGNOSIS — C7951 Secondary malignant neoplasm of bone: Secondary | ICD-10-CM | POA: Diagnosis not present

## 2018-10-08 DIAGNOSIS — Z170 Estrogen receptor positive status [ER+]: Secondary | ICD-10-CM | POA: Diagnosis not present

## 2018-10-08 DIAGNOSIS — C7951 Secondary malignant neoplasm of bone: Secondary | ICD-10-CM | POA: Diagnosis not present

## 2018-10-08 DIAGNOSIS — Z79899 Other long term (current) drug therapy: Secondary | ICD-10-CM | POA: Diagnosis not present

## 2018-10-08 DIAGNOSIS — C50311 Malignant neoplasm of lower-inner quadrant of right female breast: Secondary | ICD-10-CM | POA: Diagnosis not present

## 2018-10-11 DIAGNOSIS — Z510 Encounter for antineoplastic radiation therapy: Secondary | ICD-10-CM | POA: Diagnosis not present

## 2018-10-11 DIAGNOSIS — C7951 Secondary malignant neoplasm of bone: Secondary | ICD-10-CM | POA: Diagnosis not present

## 2018-10-11 DIAGNOSIS — C50919 Malignant neoplasm of unspecified site of unspecified female breast: Secondary | ICD-10-CM | POA: Diagnosis not present

## 2018-11-05 DIAGNOSIS — L304 Erythema intertrigo: Secondary | ICD-10-CM | POA: Diagnosis not present

## 2018-11-05 DIAGNOSIS — D485 Neoplasm of uncertain behavior of skin: Secondary | ICD-10-CM | POA: Diagnosis not present

## 2018-11-05 DIAGNOSIS — Z85828 Personal history of other malignant neoplasm of skin: Secondary | ICD-10-CM | POA: Diagnosis not present

## 2018-11-05 DIAGNOSIS — Z08 Encounter for follow-up examination after completed treatment for malignant neoplasm: Secondary | ICD-10-CM | POA: Diagnosis not present

## 2018-11-05 DIAGNOSIS — C44519 Basal cell carcinoma of skin of other part of trunk: Secondary | ICD-10-CM | POA: Diagnosis not present

## 2018-11-12 DIAGNOSIS — C7951 Secondary malignant neoplasm of bone: Secondary | ICD-10-CM | POA: Diagnosis not present

## 2018-11-12 DIAGNOSIS — B368 Other specified superficial mycoses: Secondary | ICD-10-CM | POA: Diagnosis not present

## 2018-11-12 DIAGNOSIS — C50311 Malignant neoplasm of lower-inner quadrant of right female breast: Secondary | ICD-10-CM | POA: Diagnosis not present

## 2018-11-12 DIAGNOSIS — Z79899 Other long term (current) drug therapy: Secondary | ICD-10-CM | POA: Diagnosis not present

## 2018-11-12 DIAGNOSIS — M899 Disorder of bone, unspecified: Secondary | ICD-10-CM | POA: Diagnosis not present

## 2018-11-12 DIAGNOSIS — C7952 Secondary malignant neoplasm of bone marrow: Secondary | ICD-10-CM | POA: Diagnosis not present

## 2018-11-12 DIAGNOSIS — Z170 Estrogen receptor positive status [ER+]: Secondary | ICD-10-CM | POA: Diagnosis not present

## 2018-11-26 DIAGNOSIS — C50311 Malignant neoplasm of lower-inner quadrant of right female breast: Secondary | ICD-10-CM | POA: Diagnosis not present

## 2018-11-26 DIAGNOSIS — Z170 Estrogen receptor positive status [ER+]: Secondary | ICD-10-CM | POA: Diagnosis not present

## 2018-11-29 DIAGNOSIS — Z853 Personal history of malignant neoplasm of breast: Secondary | ICD-10-CM | POA: Diagnosis not present

## 2018-11-29 DIAGNOSIS — Z1231 Encounter for screening mammogram for malignant neoplasm of breast: Secondary | ICD-10-CM | POA: Diagnosis not present

## 2018-12-10 DIAGNOSIS — C7951 Secondary malignant neoplasm of bone: Secondary | ICD-10-CM | POA: Diagnosis not present

## 2018-12-10 DIAGNOSIS — Z170 Estrogen receptor positive status [ER+]: Secondary | ICD-10-CM | POA: Diagnosis not present

## 2018-12-10 DIAGNOSIS — C7952 Secondary malignant neoplasm of bone marrow: Secondary | ICD-10-CM | POA: Diagnosis not present

## 2018-12-10 DIAGNOSIS — C50311 Malignant neoplasm of lower-inner quadrant of right female breast: Secondary | ICD-10-CM | POA: Diagnosis not present

## 2018-12-14 DIAGNOSIS — C50311 Malignant neoplasm of lower-inner quadrant of right female breast: Secondary | ICD-10-CM | POA: Diagnosis not present

## 2018-12-14 DIAGNOSIS — Z170 Estrogen receptor positive status [ER+]: Secondary | ICD-10-CM | POA: Diagnosis not present

## 2018-12-17 DIAGNOSIS — X32XXXS Exposure to sunlight, sequela: Secondary | ICD-10-CM | POA: Diagnosis not present

## 2018-12-17 DIAGNOSIS — Z170 Estrogen receptor positive status [ER+]: Secondary | ICD-10-CM | POA: Diagnosis not present

## 2018-12-17 DIAGNOSIS — C50311 Malignant neoplasm of lower-inner quadrant of right female breast: Secondary | ICD-10-CM | POA: Diagnosis not present

## 2018-12-17 DIAGNOSIS — D485 Neoplasm of uncertain behavior of skin: Secondary | ICD-10-CM | POA: Diagnosis not present

## 2018-12-17 DIAGNOSIS — C44519 Basal cell carcinoma of skin of other part of trunk: Secondary | ICD-10-CM | POA: Diagnosis not present

## 2018-12-17 DIAGNOSIS — L814 Other melanin hyperpigmentation: Secondary | ICD-10-CM | POA: Diagnosis not present

## 2018-12-17 DIAGNOSIS — C44319 Basal cell carcinoma of skin of other parts of face: Secondary | ICD-10-CM | POA: Diagnosis not present

## 2018-12-31 DIAGNOSIS — E785 Hyperlipidemia, unspecified: Secondary | ICD-10-CM | POA: Diagnosis not present

## 2018-12-31 DIAGNOSIS — F418 Other specified anxiety disorders: Secondary | ICD-10-CM | POA: Diagnosis not present

## 2018-12-31 DIAGNOSIS — R52 Pain, unspecified: Secondary | ICD-10-CM | POA: Diagnosis not present

## 2018-12-31 DIAGNOSIS — R609 Edema, unspecified: Secondary | ICD-10-CM | POA: Diagnosis not present

## 2018-12-31 DIAGNOSIS — I10 Essential (primary) hypertension: Secondary | ICD-10-CM | POA: Diagnosis not present

## 2019-01-07 DIAGNOSIS — C7952 Secondary malignant neoplasm of bone marrow: Secondary | ICD-10-CM | POA: Diagnosis not present

## 2019-01-07 DIAGNOSIS — Z79899 Other long term (current) drug therapy: Secondary | ICD-10-CM | POA: Diagnosis not present

## 2019-01-07 DIAGNOSIS — Z170 Estrogen receptor positive status [ER+]: Secondary | ICD-10-CM | POA: Diagnosis not present

## 2019-01-07 DIAGNOSIS — C50311 Malignant neoplasm of lower-inner quadrant of right female breast: Secondary | ICD-10-CM | POA: Diagnosis not present

## 2019-01-07 DIAGNOSIS — C7951 Secondary malignant neoplasm of bone: Secondary | ICD-10-CM | POA: Diagnosis not present

## 2019-02-04 DIAGNOSIS — Z79899 Other long term (current) drug therapy: Secondary | ICD-10-CM | POA: Diagnosis not present

## 2019-02-04 DIAGNOSIS — C50311 Malignant neoplasm of lower-inner quadrant of right female breast: Secondary | ICD-10-CM | POA: Diagnosis not present

## 2019-02-04 DIAGNOSIS — C7951 Secondary malignant neoplasm of bone: Secondary | ICD-10-CM | POA: Diagnosis not present

## 2019-02-04 DIAGNOSIS — Z79811 Long term (current) use of aromatase inhibitors: Secondary | ICD-10-CM | POA: Diagnosis not present

## 2019-02-04 DIAGNOSIS — Z170 Estrogen receptor positive status [ER+]: Secondary | ICD-10-CM | POA: Diagnosis not present

## 2019-02-04 DIAGNOSIS — C7952 Secondary malignant neoplasm of bone marrow: Secondary | ICD-10-CM | POA: Diagnosis not present

## 2019-03-05 DIAGNOSIS — C7951 Secondary malignant neoplasm of bone: Secondary | ICD-10-CM | POA: Diagnosis not present

## 2019-03-05 DIAGNOSIS — Z79811 Long term (current) use of aromatase inhibitors: Secondary | ICD-10-CM | POA: Diagnosis not present

## 2019-03-05 DIAGNOSIS — C50311 Malignant neoplasm of lower-inner quadrant of right female breast: Secondary | ICD-10-CM | POA: Diagnosis not present

## 2019-03-05 DIAGNOSIS — R05 Cough: Secondary | ICD-10-CM | POA: Diagnosis not present

## 2019-03-05 DIAGNOSIS — R5383 Other fatigue: Secondary | ICD-10-CM | POA: Diagnosis not present

## 2019-03-05 DIAGNOSIS — Z170 Estrogen receptor positive status [ER+]: Secondary | ICD-10-CM | POA: Diagnosis not present

## 2019-03-05 DIAGNOSIS — Z79899 Other long term (current) drug therapy: Secondary | ICD-10-CM | POA: Diagnosis not present

## 2019-03-05 DIAGNOSIS — D72819 Decreased white blood cell count, unspecified: Secondary | ICD-10-CM | POA: Diagnosis not present

## 2019-03-05 DIAGNOSIS — C7952 Secondary malignant neoplasm of bone marrow: Secondary | ICD-10-CM | POA: Diagnosis not present

## 2019-03-05 DIAGNOSIS — D7589 Other specified diseases of blood and blood-forming organs: Secondary | ICD-10-CM | POA: Diagnosis not present

## 2019-04-01 DIAGNOSIS — C779 Secondary and unspecified malignant neoplasm of lymph node, unspecified: Secondary | ICD-10-CM | POA: Diagnosis not present

## 2019-04-01 DIAGNOSIS — C7951 Secondary malignant neoplasm of bone: Secondary | ICD-10-CM | POA: Diagnosis not present

## 2019-04-01 DIAGNOSIS — C7801 Secondary malignant neoplasm of right lung: Secondary | ICD-10-CM | POA: Diagnosis not present

## 2019-04-01 DIAGNOSIS — C7802 Secondary malignant neoplasm of left lung: Secondary | ICD-10-CM | POA: Diagnosis not present

## 2019-04-01 DIAGNOSIS — Z170 Estrogen receptor positive status [ER+]: Secondary | ICD-10-CM | POA: Diagnosis not present

## 2019-04-01 DIAGNOSIS — C50311 Malignant neoplasm of lower-inner quadrant of right female breast: Secondary | ICD-10-CM | POA: Diagnosis not present

## 2019-04-01 DIAGNOSIS — C50919 Malignant neoplasm of unspecified site of unspecified female breast: Secondary | ICD-10-CM | POA: Diagnosis not present

## 2019-04-01 DIAGNOSIS — R7309 Other abnormal glucose: Secondary | ICD-10-CM | POA: Diagnosis not present

## 2019-04-02 DIAGNOSIS — D709 Neutropenia, unspecified: Secondary | ICD-10-CM | POA: Diagnosis not present

## 2019-04-02 DIAGNOSIS — Z79811 Long term (current) use of aromatase inhibitors: Secondary | ICD-10-CM | POA: Diagnosis not present

## 2019-04-02 DIAGNOSIS — Z79899 Other long term (current) drug therapy: Secondary | ICD-10-CM | POA: Diagnosis not present

## 2019-04-02 DIAGNOSIS — C50311 Malignant neoplasm of lower-inner quadrant of right female breast: Secondary | ICD-10-CM | POA: Diagnosis not present

## 2019-04-02 DIAGNOSIS — Z170 Estrogen receptor positive status [ER+]: Secondary | ICD-10-CM | POA: Diagnosis not present

## 2019-04-02 DIAGNOSIS — R222 Localized swelling, mass and lump, trunk: Secondary | ICD-10-CM | POA: Diagnosis not present

## 2019-04-02 DIAGNOSIS — C7951 Secondary malignant neoplasm of bone: Secondary | ICD-10-CM | POA: Diagnosis not present

## 2019-05-06 DIAGNOSIS — C7952 Secondary malignant neoplasm of bone marrow: Secondary | ICD-10-CM | POA: Diagnosis not present

## 2019-05-06 DIAGNOSIS — M899 Disorder of bone, unspecified: Secondary | ICD-10-CM | POA: Diagnosis not present

## 2019-05-06 DIAGNOSIS — Z79811 Long term (current) use of aromatase inhibitors: Secondary | ICD-10-CM | POA: Diagnosis not present

## 2019-05-06 DIAGNOSIS — R5383 Other fatigue: Secondary | ICD-10-CM | POA: Diagnosis not present

## 2019-05-06 DIAGNOSIS — C7951 Secondary malignant neoplasm of bone: Secondary | ICD-10-CM | POA: Diagnosis not present

## 2019-05-06 DIAGNOSIS — C50311 Malignant neoplasm of lower-inner quadrant of right female breast: Secondary | ICD-10-CM | POA: Diagnosis not present

## 2019-05-06 DIAGNOSIS — Z79899 Other long term (current) drug therapy: Secondary | ICD-10-CM | POA: Diagnosis not present

## 2019-05-06 DIAGNOSIS — Z170 Estrogen receptor positive status [ER+]: Secondary | ICD-10-CM | POA: Diagnosis not present

## 2019-06-03 DIAGNOSIS — D72819 Decreased white blood cell count, unspecified: Secondary | ICD-10-CM | POA: Diagnosis not present

## 2019-06-03 DIAGNOSIS — C50311 Malignant neoplasm of lower-inner quadrant of right female breast: Secondary | ICD-10-CM | POA: Diagnosis not present

## 2019-06-03 DIAGNOSIS — C7951 Secondary malignant neoplasm of bone: Secondary | ICD-10-CM | POA: Diagnosis not present

## 2019-06-03 DIAGNOSIS — Z79899 Other long term (current) drug therapy: Secondary | ICD-10-CM | POA: Diagnosis not present

## 2019-06-03 DIAGNOSIS — Z170 Estrogen receptor positive status [ER+]: Secondary | ICD-10-CM | POA: Diagnosis not present

## 2019-06-03 DIAGNOSIS — M899 Disorder of bone, unspecified: Secondary | ICD-10-CM | POA: Diagnosis not present

## 2019-06-03 DIAGNOSIS — C7952 Secondary malignant neoplasm of bone marrow: Secondary | ICD-10-CM | POA: Diagnosis not present

## 2019-06-12 DIAGNOSIS — Z08 Encounter for follow-up examination after completed treatment for malignant neoplasm: Secondary | ICD-10-CM | POA: Diagnosis not present

## 2019-06-12 DIAGNOSIS — C44319 Basal cell carcinoma of skin of other parts of face: Secondary | ICD-10-CM | POA: Diagnosis not present

## 2019-06-12 DIAGNOSIS — L304 Erythema intertrigo: Secondary | ICD-10-CM | POA: Diagnosis not present

## 2019-06-12 DIAGNOSIS — Z85828 Personal history of other malignant neoplasm of skin: Secondary | ICD-10-CM | POA: Diagnosis not present

## 2019-06-12 DIAGNOSIS — D485 Neoplasm of uncertain behavior of skin: Secondary | ICD-10-CM | POA: Diagnosis not present

## 2019-06-27 DIAGNOSIS — F329 Major depressive disorder, single episode, unspecified: Secondary | ICD-10-CM | POA: Diagnosis not present

## 2019-06-27 DIAGNOSIS — M791 Myalgia, unspecified site: Secondary | ICD-10-CM | POA: Diagnosis not present

## 2019-06-27 DIAGNOSIS — F419 Anxiety disorder, unspecified: Secondary | ICD-10-CM | POA: Diagnosis not present

## 2019-07-08 DIAGNOSIS — C7951 Secondary malignant neoplasm of bone: Secondary | ICD-10-CM | POA: Diagnosis not present

## 2019-07-08 DIAGNOSIS — D72819 Decreased white blood cell count, unspecified: Secondary | ICD-10-CM | POA: Diagnosis not present

## 2019-07-08 DIAGNOSIS — C50311 Malignant neoplasm of lower-inner quadrant of right female breast: Secondary | ICD-10-CM | POA: Diagnosis not present

## 2019-07-08 DIAGNOSIS — Z79899 Other long term (current) drug therapy: Secondary | ICD-10-CM | POA: Diagnosis not present

## 2019-07-08 DIAGNOSIS — Z170 Estrogen receptor positive status [ER+]: Secondary | ICD-10-CM | POA: Diagnosis not present

## 2019-07-08 DIAGNOSIS — Z79811 Long term (current) use of aromatase inhibitors: Secondary | ICD-10-CM | POA: Diagnosis not present

## 2019-08-06 DIAGNOSIS — Z79811 Long term (current) use of aromatase inhibitors: Secondary | ICD-10-CM | POA: Diagnosis not present

## 2019-08-06 DIAGNOSIS — M791 Myalgia, unspecified site: Secondary | ICD-10-CM | POA: Diagnosis not present

## 2019-08-06 DIAGNOSIS — M899 Disorder of bone, unspecified: Secondary | ICD-10-CM | POA: Diagnosis not present

## 2019-08-06 DIAGNOSIS — Z170 Estrogen receptor positive status [ER+]: Secondary | ICD-10-CM | POA: Diagnosis not present

## 2019-08-06 DIAGNOSIS — C7951 Secondary malignant neoplasm of bone: Secondary | ICD-10-CM | POA: Diagnosis not present

## 2019-08-06 DIAGNOSIS — C50919 Malignant neoplasm of unspecified site of unspecified female breast: Secondary | ICD-10-CM | POA: Diagnosis not present

## 2019-08-06 DIAGNOSIS — C50311 Malignant neoplasm of lower-inner quadrant of right female breast: Secondary | ICD-10-CM | POA: Diagnosis not present

## 2019-08-06 DIAGNOSIS — Z79899 Other long term (current) drug therapy: Secondary | ICD-10-CM | POA: Diagnosis not present

## 2019-08-06 DIAGNOSIS — D72819 Decreased white blood cell count, unspecified: Secondary | ICD-10-CM | POA: Diagnosis not present

## 2019-09-10 DIAGNOSIS — R5383 Other fatigue: Secondary | ICD-10-CM | POA: Diagnosis not present

## 2019-09-10 DIAGNOSIS — Z9221 Personal history of antineoplastic chemotherapy: Secondary | ICD-10-CM | POA: Diagnosis not present

## 2019-09-10 DIAGNOSIS — D72819 Decreased white blood cell count, unspecified: Secondary | ICD-10-CM | POA: Diagnosis not present

## 2019-09-10 DIAGNOSIS — C7952 Secondary malignant neoplasm of bone marrow: Secondary | ICD-10-CM | POA: Diagnosis not present

## 2019-09-10 DIAGNOSIS — Z170 Estrogen receptor positive status [ER+]: Secondary | ICD-10-CM | POA: Diagnosis not present

## 2019-09-10 DIAGNOSIS — Z79899 Other long term (current) drug therapy: Secondary | ICD-10-CM | POA: Diagnosis not present

## 2019-09-10 DIAGNOSIS — Z79811 Long term (current) use of aromatase inhibitors: Secondary | ICD-10-CM | POA: Diagnosis not present

## 2019-09-10 DIAGNOSIS — D709 Neutropenia, unspecified: Secondary | ICD-10-CM | POA: Diagnosis not present

## 2019-09-10 DIAGNOSIS — M791 Myalgia, unspecified site: Secondary | ICD-10-CM | POA: Diagnosis not present

## 2019-09-10 DIAGNOSIS — C7951 Secondary malignant neoplasm of bone: Secondary | ICD-10-CM | POA: Diagnosis not present

## 2019-09-10 DIAGNOSIS — M899 Disorder of bone, unspecified: Secondary | ICD-10-CM | POA: Diagnosis not present

## 2019-09-10 DIAGNOSIS — C50311 Malignant neoplasm of lower-inner quadrant of right female breast: Secondary | ICD-10-CM | POA: Diagnosis not present

## 2019-10-08 DIAGNOSIS — C50311 Malignant neoplasm of lower-inner quadrant of right female breast: Secondary | ICD-10-CM | POA: Diagnosis not present

## 2019-10-08 DIAGNOSIS — Z170 Estrogen receptor positive status [ER+]: Secondary | ICD-10-CM | POA: Diagnosis not present

## 2019-10-08 DIAGNOSIS — M899 Disorder of bone, unspecified: Secondary | ICD-10-CM | POA: Diagnosis not present

## 2019-11-04 ENCOUNTER — Ambulatory Visit: Payer: PPO | Attending: Internal Medicine

## 2019-11-04 DIAGNOSIS — Z23 Encounter for immunization: Secondary | ICD-10-CM | POA: Insufficient documentation

## 2019-11-04 NOTE — Progress Notes (Signed)
   Covid-19 Vaccination Clinic  Name:  Holly Key    MRN: [id number] DOB: 25-Mar-1951  11/04/2019  [name] was observed post Covid-19 immunization for 30 minutes based on pre-vaccination screening without incidence. She was provided with Vaccine Information Sheet and instruction to access the V-Safe system.   [name] was instructed to call 911 with any severe reactions post vaccine: Marland Kitchen Difficulty breathing  . Swelling of your face and throat  . A fast heartbeat  . A bad rash all over your body  . Dizziness and weakness    Immunizations Administered    Name Date Dose VIS Date Route   Pfizer COVID-19 Vaccine 11/04/2019  5:38 PM 0.3 mL 09/20/2019 Intramuscular   Manufacturer: [place]   Lot: [id number]   [place]: [id number]

## 2019-11-07 DIAGNOSIS — C7951 Secondary malignant neoplasm of bone: Secondary | ICD-10-CM | POA: Diagnosis not present

## 2019-11-07 DIAGNOSIS — D72819 Decreased white blood cell count, unspecified: Secondary | ICD-10-CM | POA: Diagnosis not present

## 2019-11-07 DIAGNOSIS — Z79899 Other long term (current) drug therapy: Secondary | ICD-10-CM | POA: Diagnosis not present

## 2019-11-07 DIAGNOSIS — Z170 Estrogen receptor positive status [ER+]: Secondary | ICD-10-CM | POA: Diagnosis not present

## 2019-11-07 DIAGNOSIS — C50311 Malignant neoplasm of lower-inner quadrant of right female breast: Secondary | ICD-10-CM | POA: Diagnosis not present

## 2019-11-25 ENCOUNTER — Ambulatory Visit: Payer: PPO | Attending: Internal Medicine

## 2019-11-25 DIAGNOSIS — Z23 Encounter for immunization: Secondary | ICD-10-CM | POA: Insufficient documentation

## 2019-11-25 NOTE — Progress Notes (Signed)
   [facility]  Name:  Holly Key    MRN: [id number] DOB: 1950/10/31  11/25/2019  Holly Key was observed post Covid-19 immunization for 30 minutes based on pre-vaccination screening without incidence. She was provided with Vaccine Information Sheet and instruction to access the V-Safe system.   Holly Key was instructed to call 911 with any severe reactions post vaccine: Marland Kitchen Difficulty breathing  . Swelling of your face and throat  . A fast heartbeat  . A bad rash all over your body  . Dizziness and weakness    Immunizations Administered    Name Date Dose VIS Date Route   Pfizer COVID-19 Vaccine 11/25/2019  1:08 PM 0.3 mL 09/20/2019 Intramuscular   Manufacturer: [place]   Lot: [id number]   [place]: [id number]

## 2019-12-04 DIAGNOSIS — C50311 Malignant neoplasm of lower-inner quadrant of right female breast: Secondary | ICD-10-CM | POA: Diagnosis not present

## 2019-12-04 DIAGNOSIS — C50919 Malignant neoplasm of unspecified site of unspecified female breast: Secondary | ICD-10-CM | POA: Diagnosis not present

## 2019-12-04 DIAGNOSIS — Z170 Estrogen receptor positive status [ER+]: Secondary | ICD-10-CM | POA: Diagnosis not present

## 2019-12-04 DIAGNOSIS — C7951 Secondary malignant neoplasm of bone: Secondary | ICD-10-CM | POA: Diagnosis not present

## 2019-12-04 DIAGNOSIS — R7309 Other abnormal glucose: Secondary | ICD-10-CM | POA: Diagnosis not present

## 2019-12-05 DIAGNOSIS — Z170 Estrogen receptor positive status [ER+]: Secondary | ICD-10-CM | POA: Diagnosis not present

## 2019-12-05 DIAGNOSIS — D72819 Decreased white blood cell count, unspecified: Secondary | ICD-10-CM | POA: Diagnosis not present

## 2019-12-05 DIAGNOSIS — C7951 Secondary malignant neoplasm of bone: Secondary | ICD-10-CM | POA: Diagnosis not present

## 2019-12-05 DIAGNOSIS — C50311 Malignant neoplasm of lower-inner quadrant of right female breast: Secondary | ICD-10-CM | POA: Diagnosis not present

## 2019-12-05 DIAGNOSIS — R911 Solitary pulmonary nodule: Secondary | ICD-10-CM | POA: Diagnosis not present

## 2019-12-05 DIAGNOSIS — C50919 Malignant neoplasm of unspecified site of unspecified female breast: Secondary | ICD-10-CM | POA: Diagnosis not present

## 2019-12-05 DIAGNOSIS — R222 Localized swelling, mass and lump, trunk: Secondary | ICD-10-CM | POA: Diagnosis not present

## 2019-12-05 DIAGNOSIS — Z79899 Other long term (current) drug therapy: Secondary | ICD-10-CM | POA: Diagnosis not present

## 2019-12-12 DIAGNOSIS — R609 Edema, unspecified: Secondary | ICD-10-CM | POA: Diagnosis not present

## 2019-12-12 DIAGNOSIS — F329 Major depressive disorder, single episode, unspecified: Secondary | ICD-10-CM | POA: Diagnosis not present

## 2019-12-12 DIAGNOSIS — E785 Hyperlipidemia, unspecified: Secondary | ICD-10-CM | POA: Diagnosis not present

## 2019-12-12 DIAGNOSIS — F419 Anxiety disorder, unspecified: Secondary | ICD-10-CM | POA: Diagnosis not present

## 2019-12-12 DIAGNOSIS — I10 Essential (primary) hypertension: Secondary | ICD-10-CM | POA: Diagnosis not present

## 2020-01-02 DIAGNOSIS — Z79899 Other long term (current) drug therapy: Secondary | ICD-10-CM | POA: Diagnosis not present

## 2020-01-02 DIAGNOSIS — C50311 Malignant neoplasm of lower-inner quadrant of right female breast: Secondary | ICD-10-CM | POA: Diagnosis not present

## 2020-01-02 DIAGNOSIS — M549 Dorsalgia, unspecified: Secondary | ICD-10-CM | POA: Diagnosis not present

## 2020-01-02 DIAGNOSIS — C50919 Malignant neoplasm of unspecified site of unspecified female breast: Secondary | ICD-10-CM | POA: Diagnosis not present

## 2020-01-02 DIAGNOSIS — D72819 Decreased white blood cell count, unspecified: Secondary | ICD-10-CM | POA: Diagnosis not present

## 2020-01-02 DIAGNOSIS — Z170 Estrogen receptor positive status [ER+]: Secondary | ICD-10-CM | POA: Diagnosis not present

## 2020-01-02 DIAGNOSIS — C7951 Secondary malignant neoplasm of bone: Secondary | ICD-10-CM | POA: Diagnosis not present

## 2020-01-30 DIAGNOSIS — Z79899 Other long term (current) drug therapy: Secondary | ICD-10-CM | POA: Diagnosis not present

## 2020-01-30 DIAGNOSIS — C7951 Secondary malignant neoplasm of bone: Secondary | ICD-10-CM | POA: Diagnosis not present

## 2020-01-30 DIAGNOSIS — Z170 Estrogen receptor positive status [ER+]: Secondary | ICD-10-CM | POA: Diagnosis not present

## 2020-01-30 DIAGNOSIS — C50311 Malignant neoplasm of lower-inner quadrant of right female breast: Secondary | ICD-10-CM | POA: Diagnosis not present

## 2020-01-30 DIAGNOSIS — M899 Disorder of bone, unspecified: Secondary | ICD-10-CM | POA: Diagnosis not present

## 2020-01-30 DIAGNOSIS — D72819 Decreased white blood cell count, unspecified: Secondary | ICD-10-CM | POA: Diagnosis not present

## 2020-02-27 DIAGNOSIS — M25551 Pain in right hip: Secondary | ICD-10-CM | POA: Diagnosis not present

## 2020-02-27 DIAGNOSIS — S79911A Unspecified injury of right hip, initial encounter: Secondary | ICD-10-CM | POA: Diagnosis not present

## 2020-02-27 DIAGNOSIS — S0990XA Unspecified injury of head, initial encounter: Secondary | ICD-10-CM | POA: Diagnosis not present

## 2020-02-27 DIAGNOSIS — R519 Headache, unspecified: Secondary | ICD-10-CM | POA: Diagnosis not present

## 2020-03-04 DIAGNOSIS — C50311 Malignant neoplasm of lower-inner quadrant of right female breast: Secondary | ICD-10-CM | POA: Diagnosis not present

## 2020-03-04 DIAGNOSIS — Z79899 Other long term (current) drug therapy: Secondary | ICD-10-CM | POA: Diagnosis not present

## 2020-03-04 DIAGNOSIS — Z170 Estrogen receptor positive status [ER+]: Secondary | ICD-10-CM | POA: Diagnosis not present

## 2020-03-04 DIAGNOSIS — D72819 Decreased white blood cell count, unspecified: Secondary | ICD-10-CM | POA: Diagnosis not present

## 2020-03-04 DIAGNOSIS — C7951 Secondary malignant neoplasm of bone: Secondary | ICD-10-CM | POA: Diagnosis not present

## 2020-03-04 DIAGNOSIS — R197 Diarrhea, unspecified: Secondary | ICD-10-CM | POA: Diagnosis not present

## 2020-03-19 DIAGNOSIS — C44311 Basal cell carcinoma of skin of nose: Secondary | ICD-10-CM | POA: Diagnosis not present

## 2020-04-02 DIAGNOSIS — C7951 Secondary malignant neoplasm of bone: Secondary | ICD-10-CM | POA: Diagnosis not present

## 2020-04-02 DIAGNOSIS — Z79899 Other long term (current) drug therapy: Secondary | ICD-10-CM | POA: Diagnosis not present

## 2020-04-02 DIAGNOSIS — D709 Neutropenia, unspecified: Secondary | ICD-10-CM | POA: Diagnosis not present

## 2020-04-02 DIAGNOSIS — D72819 Decreased white blood cell count, unspecified: Secondary | ICD-10-CM | POA: Diagnosis not present

## 2020-04-02 DIAGNOSIS — C50311 Malignant neoplasm of lower-inner quadrant of right female breast: Secondary | ICD-10-CM | POA: Diagnosis not present

## 2020-04-02 DIAGNOSIS — R197 Diarrhea, unspecified: Secondary | ICD-10-CM | POA: Diagnosis not present

## 2020-04-02 DIAGNOSIS — Z170 Estrogen receptor positive status [ER+]: Secondary | ICD-10-CM | POA: Diagnosis not present

## 2020-05-01 DIAGNOSIS — Z79899 Other long term (current) drug therapy: Secondary | ICD-10-CM | POA: Diagnosis not present

## 2020-05-01 DIAGNOSIS — D259 Leiomyoma of uterus, unspecified: Secondary | ICD-10-CM | POA: Diagnosis not present

## 2020-05-01 DIAGNOSIS — M899 Disorder of bone, unspecified: Secondary | ICD-10-CM | POA: Diagnosis not present

## 2020-05-01 DIAGNOSIS — R197 Diarrhea, unspecified: Secondary | ICD-10-CM | POA: Diagnosis not present

## 2020-05-01 DIAGNOSIS — Z170 Estrogen receptor positive status [ER+]: Secondary | ICD-10-CM | POA: Diagnosis not present

## 2020-05-01 DIAGNOSIS — D72819 Decreased white blood cell count, unspecified: Secondary | ICD-10-CM | POA: Diagnosis not present

## 2020-05-01 DIAGNOSIS — C7951 Secondary malignant neoplasm of bone: Secondary | ICD-10-CM | POA: Diagnosis not present

## 2020-05-01 DIAGNOSIS — C50311 Malignant neoplasm of lower-inner quadrant of right female breast: Secondary | ICD-10-CM | POA: Diagnosis not present

## 2020-05-08 DIAGNOSIS — Z8742 Personal history of other diseases of the female genital tract: Secondary | ICD-10-CM | POA: Diagnosis not present

## 2020-05-08 DIAGNOSIS — Z9889 Other specified postprocedural states: Secondary | ICD-10-CM | POA: Diagnosis not present

## 2020-05-08 DIAGNOSIS — R102 Pelvic and perineal pain: Secondary | ICD-10-CM | POA: Diagnosis not present

## 2020-05-08 DIAGNOSIS — D251 Intramural leiomyoma of uterus: Secondary | ICD-10-CM | POA: Diagnosis not present

## 2020-05-29 DIAGNOSIS — C7951 Secondary malignant neoplasm of bone: Secondary | ICD-10-CM | POA: Diagnosis not present

## 2020-05-29 DIAGNOSIS — K4090 Unilateral inguinal hernia, without obstruction or gangrene, not specified as recurrent: Secondary | ICD-10-CM | POA: Diagnosis not present

## 2020-05-29 DIAGNOSIS — C50311 Malignant neoplasm of lower-inner quadrant of right female breast: Secondary | ICD-10-CM | POA: Diagnosis not present

## 2020-05-29 DIAGNOSIS — C50919 Malignant neoplasm of unspecified site of unspecified female breast: Secondary | ICD-10-CM | POA: Diagnosis not present

## 2020-05-29 DIAGNOSIS — R911 Solitary pulmonary nodule: Secondary | ICD-10-CM | POA: Diagnosis not present

## 2020-05-29 DIAGNOSIS — M7989 Other specified soft tissue disorders: Secondary | ICD-10-CM | POA: Diagnosis not present

## 2020-05-29 DIAGNOSIS — D259 Leiomyoma of uterus, unspecified: Secondary | ICD-10-CM | POA: Diagnosis not present

## 2020-05-29 DIAGNOSIS — Z170 Estrogen receptor positive status [ER+]: Secondary | ICD-10-CM | POA: Diagnosis not present

## 2020-05-29 DIAGNOSIS — R7301 Impaired fasting glucose: Secondary | ICD-10-CM | POA: Diagnosis not present

## 2020-06-01 DIAGNOSIS — Z79899 Other long term (current) drug therapy: Secondary | ICD-10-CM | POA: Diagnosis not present

## 2020-06-01 DIAGNOSIS — C50311 Malignant neoplasm of lower-inner quadrant of right female breast: Secondary | ICD-10-CM | POA: Diagnosis not present

## 2020-06-01 DIAGNOSIS — I517 Cardiomegaly: Secondary | ICD-10-CM | POA: Diagnosis not present

## 2020-06-01 DIAGNOSIS — R0602 Shortness of breath: Secondary | ICD-10-CM | POA: Diagnosis not present

## 2020-06-01 DIAGNOSIS — D259 Leiomyoma of uterus, unspecified: Secondary | ICD-10-CM | POA: Diagnosis not present

## 2020-06-01 DIAGNOSIS — D72819 Decreased white blood cell count, unspecified: Secondary | ICD-10-CM | POA: Diagnosis not present

## 2020-06-01 DIAGNOSIS — D709 Neutropenia, unspecified: Secondary | ICD-10-CM | POA: Diagnosis not present

## 2020-06-01 DIAGNOSIS — C7951 Secondary malignant neoplasm of bone: Secondary | ICD-10-CM | POA: Diagnosis not present

## 2020-06-01 DIAGNOSIS — B379 Candidiasis, unspecified: Secondary | ICD-10-CM | POA: Diagnosis not present

## 2020-06-01 DIAGNOSIS — R197 Diarrhea, unspecified: Secondary | ICD-10-CM | POA: Diagnosis not present

## 2020-06-01 DIAGNOSIS — Z170 Estrogen receptor positive status [ER+]: Secondary | ICD-10-CM | POA: Diagnosis not present

## 2020-06-01 DIAGNOSIS — R918 Other nonspecific abnormal finding of lung field: Secondary | ICD-10-CM | POA: Diagnosis not present

## 2020-06-03 DIAGNOSIS — F419 Anxiety disorder, unspecified: Secondary | ICD-10-CM | POA: Diagnosis not present

## 2020-06-03 DIAGNOSIS — F329 Major depressive disorder, single episode, unspecified: Secondary | ICD-10-CM | POA: Diagnosis not present

## 2020-06-03 DIAGNOSIS — Z79899 Other long term (current) drug therapy: Secondary | ICD-10-CM | POA: Diagnosis not present

## 2020-06-03 DIAGNOSIS — M791 Myalgia, unspecified site: Secondary | ICD-10-CM | POA: Diagnosis not present

## 2020-06-05 DIAGNOSIS — Z170 Estrogen receptor positive status [ER+]: Secondary | ICD-10-CM | POA: Diagnosis not present

## 2020-06-05 DIAGNOSIS — I517 Cardiomegaly: Secondary | ICD-10-CM | POA: Diagnosis not present

## 2020-06-05 DIAGNOSIS — R0602 Shortness of breath: Secondary | ICD-10-CM | POA: Diagnosis not present

## 2020-06-05 DIAGNOSIS — I700 Atherosclerosis of aorta: Secondary | ICD-10-CM | POA: Diagnosis not present

## 2020-06-05 DIAGNOSIS — C50311 Malignant neoplasm of lower-inner quadrant of right female breast: Secondary | ICD-10-CM | POA: Diagnosis not present

## 2020-06-30 DIAGNOSIS — Z170 Estrogen receptor positive status [ER+]: Secondary | ICD-10-CM | POA: Diagnosis not present

## 2020-06-30 DIAGNOSIS — C7952 Secondary malignant neoplasm of bone marrow: Secondary | ICD-10-CM | POA: Diagnosis not present

## 2020-06-30 DIAGNOSIS — R911 Solitary pulmonary nodule: Secondary | ICD-10-CM | POA: Diagnosis not present

## 2020-06-30 DIAGNOSIS — C50311 Malignant neoplasm of lower-inner quadrant of right female breast: Secondary | ICD-10-CM | POA: Diagnosis not present

## 2020-06-30 DIAGNOSIS — I517 Cardiomegaly: Secondary | ICD-10-CM | POA: Diagnosis not present

## 2020-06-30 DIAGNOSIS — R0602 Shortness of breath: Secondary | ICD-10-CM | POA: Diagnosis not present

## 2020-06-30 DIAGNOSIS — R197 Diarrhea, unspecified: Secondary | ICD-10-CM | POA: Diagnosis not present

## 2020-06-30 DIAGNOSIS — D72819 Decreased white blood cell count, unspecified: Secondary | ICD-10-CM | POA: Diagnosis not present

## 2020-06-30 DIAGNOSIS — C7951 Secondary malignant neoplasm of bone: Secondary | ICD-10-CM | POA: Diagnosis not present

## 2020-07-14 DIAGNOSIS — H2513 Age-related nuclear cataract, bilateral: Secondary | ICD-10-CM | POA: Diagnosis not present

## 2020-07-14 DIAGNOSIS — H16223 Keratoconjunctivitis sicca, not specified as Sjogren's, bilateral: Secondary | ICD-10-CM | POA: Diagnosis not present

## 2020-07-14 DIAGNOSIS — H25013 Cortical age-related cataract, bilateral: Secondary | ICD-10-CM | POA: Diagnosis not present

## 2020-07-14 DIAGNOSIS — H16143 Punctate keratitis, bilateral: Secondary | ICD-10-CM | POA: Diagnosis not present

## 2020-07-28 DIAGNOSIS — D259 Leiomyoma of uterus, unspecified: Secondary | ICD-10-CM | POA: Diagnosis not present

## 2020-07-28 DIAGNOSIS — D709 Neutropenia, unspecified: Secondary | ICD-10-CM | POA: Diagnosis not present

## 2020-07-28 DIAGNOSIS — Z86018 Personal history of other benign neoplasm: Secondary | ICD-10-CM | POA: Diagnosis not present

## 2020-07-28 DIAGNOSIS — R0602 Shortness of breath: Secondary | ICD-10-CM | POA: Diagnosis not present

## 2020-07-28 DIAGNOSIS — I517 Cardiomegaly: Secondary | ICD-10-CM | POA: Diagnosis not present

## 2020-07-28 DIAGNOSIS — C50311 Malignant neoplasm of lower-inner quadrant of right female breast: Secondary | ICD-10-CM | POA: Diagnosis not present

## 2020-07-28 DIAGNOSIS — C7951 Secondary malignant neoplasm of bone: Secondary | ICD-10-CM | POA: Diagnosis not present

## 2020-07-28 DIAGNOSIS — Z79899 Other long term (current) drug therapy: Secondary | ICD-10-CM | POA: Diagnosis not present

## 2020-07-28 DIAGNOSIS — D72819 Decreased white blood cell count, unspecified: Secondary | ICD-10-CM | POA: Diagnosis not present

## 2020-07-28 DIAGNOSIS — R197 Diarrhea, unspecified: Secondary | ICD-10-CM | POA: Diagnosis not present

## 2020-07-28 DIAGNOSIS — Z170 Estrogen receptor positive status [ER+]: Secondary | ICD-10-CM | POA: Diagnosis not present

## 2020-08-10 DIAGNOSIS — Z1159 Encounter for screening for other viral diseases: Secondary | ICD-10-CM | POA: Diagnosis not present

## 2020-08-10 DIAGNOSIS — Z1152 Encounter for screening for COVID-19: Secondary | ICD-10-CM | POA: Diagnosis not present

## 2020-08-25 DIAGNOSIS — Z79899 Other long term (current) drug therapy: Secondary | ICD-10-CM | POA: Diagnosis not present

## 2020-08-25 DIAGNOSIS — I517 Cardiomegaly: Secondary | ICD-10-CM | POA: Diagnosis not present

## 2020-08-25 DIAGNOSIS — M25511 Pain in right shoulder: Secondary | ICD-10-CM | POA: Diagnosis not present

## 2020-08-25 DIAGNOSIS — R0602 Shortness of breath: Secondary | ICD-10-CM | POA: Diagnosis not present

## 2020-08-25 DIAGNOSIS — Z170 Estrogen receptor positive status [ER+]: Secondary | ICD-10-CM | POA: Diagnosis not present

## 2020-08-25 DIAGNOSIS — C7952 Secondary malignant neoplasm of bone marrow: Secondary | ICD-10-CM | POA: Diagnosis not present

## 2020-08-25 DIAGNOSIS — D709 Neutropenia, unspecified: Secondary | ICD-10-CM | POA: Diagnosis not present

## 2020-08-25 DIAGNOSIS — C50311 Malignant neoplasm of lower-inner quadrant of right female breast: Secondary | ICD-10-CM | POA: Diagnosis not present

## 2020-08-25 DIAGNOSIS — C7951 Secondary malignant neoplasm of bone: Secondary | ICD-10-CM | POA: Diagnosis not present

## 2020-08-25 DIAGNOSIS — D251 Intramural leiomyoma of uterus: Secondary | ICD-10-CM | POA: Diagnosis not present

## 2020-08-25 DIAGNOSIS — Z86018 Personal history of other benign neoplasm: Secondary | ICD-10-CM | POA: Diagnosis not present

## 2020-09-21 DIAGNOSIS — C4441 Basal cell carcinoma of skin of scalp and neck: Secondary | ICD-10-CM | POA: Diagnosis not present

## 2020-09-21 DIAGNOSIS — Z85828 Personal history of other malignant neoplasm of skin: Secondary | ICD-10-CM | POA: Diagnosis not present

## 2020-09-21 DIAGNOSIS — C4401 Basal cell carcinoma of skin of lip: Secondary | ICD-10-CM | POA: Diagnosis not present

## 2020-09-21 DIAGNOSIS — C44519 Basal cell carcinoma of skin of other part of trunk: Secondary | ICD-10-CM | POA: Diagnosis not present

## 2020-09-24 DIAGNOSIS — D72819 Decreased white blood cell count, unspecified: Secondary | ICD-10-CM | POA: Diagnosis not present

## 2020-09-24 DIAGNOSIS — D7589 Other specified diseases of blood and blood-forming organs: Secondary | ICD-10-CM | POA: Diagnosis not present

## 2020-09-24 DIAGNOSIS — C50311 Malignant neoplasm of lower-inner quadrant of right female breast: Secondary | ICD-10-CM | POA: Diagnosis not present

## 2020-09-24 DIAGNOSIS — Z170 Estrogen receptor positive status [ER+]: Secondary | ICD-10-CM | POA: Diagnosis not present

## 2020-09-24 DIAGNOSIS — Z79899 Other long term (current) drug therapy: Secondary | ICD-10-CM | POA: Diagnosis not present

## 2020-09-24 DIAGNOSIS — C7951 Secondary malignant neoplasm of bone: Secondary | ICD-10-CM | POA: Diagnosis not present

## 2020-10-07 DIAGNOSIS — R1033 Periumbilical pain: Secondary | ICD-10-CM | POA: Diagnosis not present

## 2020-10-07 DIAGNOSIS — Z79899 Other long term (current) drug therapy: Secondary | ICD-10-CM | POA: Diagnosis not present

## 2020-10-07 DIAGNOSIS — Z79811 Long term (current) use of aromatase inhibitors: Secondary | ICD-10-CM | POA: Diagnosis not present

## 2020-10-07 DIAGNOSIS — C50919 Malignant neoplasm of unspecified site of unspecified female breast: Secondary | ICD-10-CM | POA: Diagnosis not present

## 2020-10-07 DIAGNOSIS — R300 Dysuria: Secondary | ICD-10-CM | POA: Diagnosis not present

## 2020-10-07 DIAGNOSIS — C7951 Secondary malignant neoplasm of bone: Secondary | ICD-10-CM | POA: Diagnosis not present

## 2020-10-07 DIAGNOSIS — Z20822 Contact with and (suspected) exposure to covid-19: Secondary | ICD-10-CM | POA: Diagnosis not present

## 2020-10-07 DIAGNOSIS — R509 Fever, unspecified: Secondary | ICD-10-CM | POA: Diagnosis not present

## 2020-10-07 DIAGNOSIS — C50311 Malignant neoplasm of lower-inner quadrant of right female breast: Secondary | ICD-10-CM | POA: Diagnosis not present

## 2020-10-07 DIAGNOSIS — Z170 Estrogen receptor positive status [ER+]: Secondary | ICD-10-CM | POA: Diagnosis not present

## 2020-10-07 DIAGNOSIS — M791 Myalgia, unspecified site: Secondary | ICD-10-CM | POA: Diagnosis not present

## 2020-10-28 DIAGNOSIS — D72819 Decreased white blood cell count, unspecified: Secondary | ICD-10-CM | POA: Diagnosis not present

## 2020-10-28 DIAGNOSIS — C50311 Malignant neoplasm of lower-inner quadrant of right female breast: Secondary | ICD-10-CM | POA: Diagnosis not present

## 2020-10-28 DIAGNOSIS — C7951 Secondary malignant neoplasm of bone: Secondary | ICD-10-CM | POA: Diagnosis not present

## 2020-10-28 DIAGNOSIS — Z79899 Other long term (current) drug therapy: Secondary | ICD-10-CM | POA: Diagnosis not present

## 2020-10-28 DIAGNOSIS — Z170 Estrogen receptor positive status [ER+]: Secondary | ICD-10-CM | POA: Diagnosis not present

## 2020-10-28 DIAGNOSIS — D7589 Other specified diseases of blood and blood-forming organs: Secondary | ICD-10-CM | POA: Diagnosis not present

## 2020-11-26 DIAGNOSIS — Z170 Estrogen receptor positive status [ER+]: Secondary | ICD-10-CM | POA: Diagnosis not present

## 2020-11-26 DIAGNOSIS — Z79899 Other long term (current) drug therapy: Secondary | ICD-10-CM | POA: Diagnosis not present

## 2020-11-26 DIAGNOSIS — C7951 Secondary malignant neoplasm of bone: Secondary | ICD-10-CM | POA: Diagnosis not present

## 2020-11-26 DIAGNOSIS — C50311 Malignant neoplasm of lower-inner quadrant of right female breast: Secondary | ICD-10-CM | POA: Diagnosis not present

## 2020-11-26 DIAGNOSIS — D72819 Decreased white blood cell count, unspecified: Secondary | ICD-10-CM | POA: Diagnosis not present

## 2020-11-26 DIAGNOSIS — D7589 Other specified diseases of blood and blood-forming organs: Secondary | ICD-10-CM | POA: Diagnosis not present

## 2020-12-08 DIAGNOSIS — C50919 Malignant neoplasm of unspecified site of unspecified female breast: Secondary | ICD-10-CM | POA: Diagnosis not present

## 2020-12-08 DIAGNOSIS — R7301 Impaired fasting glucose: Secondary | ICD-10-CM | POA: Diagnosis not present

## 2020-12-08 DIAGNOSIS — C50311 Malignant neoplasm of lower-inner quadrant of right female breast: Secondary | ICD-10-CM | POA: Diagnosis not present

## 2020-12-08 DIAGNOSIS — C7951 Secondary malignant neoplasm of bone: Secondary | ICD-10-CM | POA: Diagnosis not present

## 2020-12-08 DIAGNOSIS — R918 Other nonspecific abnormal finding of lung field: Secondary | ICD-10-CM | POA: Diagnosis not present

## 2020-12-16 DIAGNOSIS — R609 Edema, unspecified: Secondary | ICD-10-CM | POA: Diagnosis not present

## 2020-12-16 DIAGNOSIS — R5383 Other fatigue: Secondary | ICD-10-CM | POA: Diagnosis not present

## 2020-12-16 DIAGNOSIS — F3341 Major depressive disorder, recurrent, in partial remission: Secondary | ICD-10-CM | POA: Diagnosis not present

## 2020-12-16 DIAGNOSIS — E785 Hyperlipidemia, unspecified: Secondary | ICD-10-CM | POA: Diagnosis not present

## 2020-12-16 DIAGNOSIS — E559 Vitamin D deficiency, unspecified: Secondary | ICD-10-CM | POA: Diagnosis not present

## 2020-12-16 DIAGNOSIS — F419 Anxiety disorder, unspecified: Secondary | ICD-10-CM | POA: Diagnosis not present

## 2020-12-16 DIAGNOSIS — I10 Essential (primary) hypertension: Secondary | ICD-10-CM | POA: Diagnosis not present

## 2020-12-24 DIAGNOSIS — C50919 Malignant neoplasm of unspecified site of unspecified female breast: Secondary | ICD-10-CM | POA: Diagnosis not present

## 2020-12-24 DIAGNOSIS — Z170 Estrogen receptor positive status [ER+]: Secondary | ICD-10-CM | POA: Diagnosis not present

## 2020-12-24 DIAGNOSIS — Z803 Family history of malignant neoplasm of breast: Secondary | ICD-10-CM | POA: Diagnosis not present

## 2020-12-24 DIAGNOSIS — R5383 Other fatigue: Secondary | ICD-10-CM | POA: Diagnosis not present

## 2020-12-24 DIAGNOSIS — Z800 Family history of malignant neoplasm of digestive organs: Secondary | ICD-10-CM | POA: Diagnosis not present

## 2020-12-24 DIAGNOSIS — E785 Hyperlipidemia, unspecified: Secondary | ICD-10-CM | POA: Diagnosis not present

## 2020-12-24 DIAGNOSIS — E559 Vitamin D deficiency, unspecified: Secondary | ICD-10-CM | POA: Diagnosis not present

## 2020-12-24 DIAGNOSIS — C50311 Malignant neoplasm of lower-inner quadrant of right female breast: Secondary | ICD-10-CM | POA: Diagnosis not present

## 2021-01-21 DIAGNOSIS — C50311 Malignant neoplasm of lower-inner quadrant of right female breast: Secondary | ICD-10-CM | POA: Diagnosis not present

## 2021-01-21 DIAGNOSIS — C7951 Secondary malignant neoplasm of bone: Secondary | ICD-10-CM | POA: Diagnosis not present

## 2021-01-21 DIAGNOSIS — Z79899 Other long term (current) drug therapy: Secondary | ICD-10-CM | POA: Diagnosis not present

## 2021-01-21 DIAGNOSIS — D7589 Other specified diseases of blood and blood-forming organs: Secondary | ICD-10-CM | POA: Diagnosis not present

## 2021-01-21 DIAGNOSIS — D72819 Decreased white blood cell count, unspecified: Secondary | ICD-10-CM | POA: Diagnosis not present

## 2021-01-21 DIAGNOSIS — Z170 Estrogen receptor positive status [ER+]: Secondary | ICD-10-CM | POA: Diagnosis not present

## 2021-02-18 DIAGNOSIS — C7951 Secondary malignant neoplasm of bone: Secondary | ICD-10-CM | POA: Diagnosis not present

## 2021-02-18 DIAGNOSIS — Z170 Estrogen receptor positive status [ER+]: Secondary | ICD-10-CM | POA: Diagnosis not present

## 2021-02-18 DIAGNOSIS — Z79811 Long term (current) use of aromatase inhibitors: Secondary | ICD-10-CM | POA: Diagnosis not present

## 2021-02-18 DIAGNOSIS — D7589 Other specified diseases of blood and blood-forming organs: Secondary | ICD-10-CM | POA: Diagnosis not present

## 2021-02-18 DIAGNOSIS — C50311 Malignant neoplasm of lower-inner quadrant of right female breast: Secondary | ICD-10-CM | POA: Diagnosis not present

## 2021-02-18 DIAGNOSIS — M899 Disorder of bone, unspecified: Secondary | ICD-10-CM | POA: Diagnosis not present

## 2021-02-18 DIAGNOSIS — C50919 Malignant neoplasm of unspecified site of unspecified female breast: Secondary | ICD-10-CM | POA: Diagnosis not present

## 2021-02-18 DIAGNOSIS — Z79899 Other long term (current) drug therapy: Secondary | ICD-10-CM | POA: Diagnosis not present

## 2021-02-18 DIAGNOSIS — D72819 Decreased white blood cell count, unspecified: Secondary | ICD-10-CM | POA: Diagnosis not present

## 2021-03-18 DIAGNOSIS — C7951 Secondary malignant neoplasm of bone: Secondary | ICD-10-CM | POA: Diagnosis not present

## 2021-03-18 DIAGNOSIS — Z170 Estrogen receptor positive status [ER+]: Secondary | ICD-10-CM | POA: Diagnosis not present

## 2021-03-18 DIAGNOSIS — C50311 Malignant neoplasm of lower-inner quadrant of right female breast: Secondary | ICD-10-CM | POA: Diagnosis not present

## 2021-04-01 DIAGNOSIS — H66002 Acute suppurative otitis media without spontaneous rupture of ear drum, left ear: Secondary | ICD-10-CM | POA: Diagnosis not present

## 2021-04-01 DIAGNOSIS — H6121 Impacted cerumen, right ear: Secondary | ICD-10-CM | POA: Diagnosis not present

## 2021-04-01 DIAGNOSIS — H60313 Diffuse otitis externa, bilateral: Secondary | ICD-10-CM | POA: Diagnosis not present

## 2021-04-08 DIAGNOSIS — H9209 Otalgia, unspecified ear: Secondary | ICD-10-CM | POA: Diagnosis not present

## 2021-04-08 DIAGNOSIS — Z853 Personal history of malignant neoplasm of breast: Secondary | ICD-10-CM | POA: Diagnosis not present

## 2021-04-08 DIAGNOSIS — M542 Cervicalgia: Secondary | ICD-10-CM | POA: Diagnosis not present

## 2021-04-08 DIAGNOSIS — R911 Solitary pulmonary nodule: Secondary | ICD-10-CM | POA: Diagnosis not present

## 2021-04-08 DIAGNOSIS — H9202 Otalgia, left ear: Secondary | ICD-10-CM | POA: Diagnosis not present

## 2021-04-16 DIAGNOSIS — Z170 Estrogen receptor positive status [ER+]: Secondary | ICD-10-CM | POA: Diagnosis not present

## 2021-04-16 DIAGNOSIS — C50311 Malignant neoplasm of lower-inner quadrant of right female breast: Secondary | ICD-10-CM | POA: Diagnosis not present

## 2021-04-16 DIAGNOSIS — C7951 Secondary malignant neoplasm of bone: Secondary | ICD-10-CM | POA: Diagnosis not present

## 2021-04-16 DIAGNOSIS — C50919 Malignant neoplasm of unspecified site of unspecified female breast: Secondary | ICD-10-CM | POA: Diagnosis not present

## 2021-04-16 DIAGNOSIS — Z79899 Other long term (current) drug therapy: Secondary | ICD-10-CM | POA: Diagnosis not present

## 2021-04-16 DIAGNOSIS — D72819 Decreased white blood cell count, unspecified: Secondary | ICD-10-CM | POA: Diagnosis not present

## 2021-05-14 DIAGNOSIS — C7951 Secondary malignant neoplasm of bone: Secondary | ICD-10-CM | POA: Diagnosis not present

## 2021-05-14 DIAGNOSIS — C50311 Malignant neoplasm of lower-inner quadrant of right female breast: Secondary | ICD-10-CM | POA: Diagnosis not present

## 2021-05-14 DIAGNOSIS — D72819 Decreased white blood cell count, unspecified: Secondary | ICD-10-CM | POA: Diagnosis not present

## 2021-05-14 DIAGNOSIS — Z170 Estrogen receptor positive status [ER+]: Secondary | ICD-10-CM | POA: Diagnosis not present

## 2021-05-14 DIAGNOSIS — Z79899 Other long term (current) drug therapy: Secondary | ICD-10-CM | POA: Diagnosis not present

## 2021-05-14 DIAGNOSIS — M899 Disorder of bone, unspecified: Secondary | ICD-10-CM | POA: Diagnosis not present

## 2021-06-08 DIAGNOSIS — F419 Anxiety disorder, unspecified: Secondary | ICD-10-CM | POA: Diagnosis not present

## 2021-06-08 DIAGNOSIS — F32A Depression, unspecified: Secondary | ICD-10-CM | POA: Diagnosis not present

## 2021-06-09 DIAGNOSIS — Z85828 Personal history of other malignant neoplasm of skin: Secondary | ICD-10-CM | POA: Diagnosis not present

## 2021-06-09 DIAGNOSIS — L821 Other seborrheic keratosis: Secondary | ICD-10-CM | POA: Diagnosis not present

## 2021-06-15 DIAGNOSIS — Z79899 Other long term (current) drug therapy: Secondary | ICD-10-CM | POA: Diagnosis not present

## 2021-06-15 DIAGNOSIS — M898X8 Other specified disorders of bone, other site: Secondary | ICD-10-CM | POA: Diagnosis not present

## 2021-06-15 DIAGNOSIS — Z5181 Encounter for therapeutic drug level monitoring: Secondary | ICD-10-CM | POA: Diagnosis not present

## 2021-06-15 DIAGNOSIS — Z170 Estrogen receptor positive status [ER+]: Secondary | ICD-10-CM | POA: Diagnosis not present

## 2021-06-15 DIAGNOSIS — C50311 Malignant neoplasm of lower-inner quadrant of right female breast: Secondary | ICD-10-CM | POA: Diagnosis not present

## 2021-06-15 DIAGNOSIS — Z79811 Long term (current) use of aromatase inhibitors: Secondary | ICD-10-CM | POA: Diagnosis not present

## 2021-06-15 DIAGNOSIS — R600 Localized edema: Secondary | ICD-10-CM | POA: Diagnosis not present

## 2021-06-15 DIAGNOSIS — C50919 Malignant neoplasm of unspecified site of unspecified female breast: Secondary | ICD-10-CM | POA: Diagnosis not present

## 2021-06-15 DIAGNOSIS — C7951 Secondary malignant neoplasm of bone: Secondary | ICD-10-CM | POA: Diagnosis not present

## 2021-06-15 DIAGNOSIS — D72819 Decreased white blood cell count, unspecified: Secondary | ICD-10-CM | POA: Diagnosis not present

## 2021-07-19 DIAGNOSIS — Z5181 Encounter for therapeutic drug level monitoring: Secondary | ICD-10-CM | POA: Diagnosis not present

## 2021-07-19 DIAGNOSIS — C50919 Malignant neoplasm of unspecified site of unspecified female breast: Secondary | ICD-10-CM | POA: Diagnosis not present

## 2021-07-19 DIAGNOSIS — Z79899 Other long term (current) drug therapy: Secondary | ICD-10-CM | POA: Diagnosis not present

## 2021-07-19 DIAGNOSIS — C50311 Malignant neoplasm of lower-inner quadrant of right female breast: Secondary | ICD-10-CM | POA: Diagnosis not present

## 2021-07-19 DIAGNOSIS — C7951 Secondary malignant neoplasm of bone: Secondary | ICD-10-CM | POA: Diagnosis not present

## 2021-07-19 DIAGNOSIS — R600 Localized edema: Secondary | ICD-10-CM | POA: Diagnosis not present

## 2021-07-19 DIAGNOSIS — Z170 Estrogen receptor positive status [ER+]: Secondary | ICD-10-CM | POA: Diagnosis not present

## 2021-07-19 DIAGNOSIS — Z79811 Long term (current) use of aromatase inhibitors: Secondary | ICD-10-CM | POA: Diagnosis not present

## 2021-07-19 DIAGNOSIS — D72819 Decreased white blood cell count, unspecified: Secondary | ICD-10-CM | POA: Diagnosis not present

## 2021-07-19 DIAGNOSIS — M2550 Pain in unspecified joint: Secondary | ICD-10-CM | POA: Diagnosis not present

## 2021-08-13 DIAGNOSIS — Z170 Estrogen receptor positive status [ER+]: Secondary | ICD-10-CM | POA: Diagnosis not present

## 2021-08-13 DIAGNOSIS — R7309 Other abnormal glucose: Secondary | ICD-10-CM | POA: Diagnosis not present

## 2021-08-13 DIAGNOSIS — C50311 Malignant neoplasm of lower-inner quadrant of right female breast: Secondary | ICD-10-CM | POA: Diagnosis not present

## 2021-08-13 DIAGNOSIS — C7951 Secondary malignant neoplasm of bone: Secondary | ICD-10-CM | POA: Diagnosis not present

## 2021-08-13 DIAGNOSIS — C50919 Malignant neoplasm of unspecified site of unspecified female breast: Secondary | ICD-10-CM | POA: Diagnosis not present

## 2021-08-13 DIAGNOSIS — R9389 Abnormal findings on diagnostic imaging of other specified body structures: Secondary | ICD-10-CM | POA: Diagnosis not present

## 2021-08-13 DIAGNOSIS — M7989 Other specified soft tissue disorders: Secondary | ICD-10-CM | POA: Diagnosis not present

## 2021-08-16 DIAGNOSIS — Z5181 Encounter for therapeutic drug level monitoring: Secondary | ICD-10-CM | POA: Diagnosis not present

## 2021-08-16 DIAGNOSIS — C50919 Malignant neoplasm of unspecified site of unspecified female breast: Secondary | ICD-10-CM | POA: Diagnosis not present

## 2021-08-16 DIAGNOSIS — Z79811 Long term (current) use of aromatase inhibitors: Secondary | ICD-10-CM | POA: Diagnosis not present

## 2021-08-16 DIAGNOSIS — Z170 Estrogen receptor positive status [ER+]: Secondary | ICD-10-CM | POA: Diagnosis not present

## 2021-08-16 DIAGNOSIS — C50311 Malignant neoplasm of lower-inner quadrant of right female breast: Secondary | ICD-10-CM | POA: Diagnosis not present

## 2021-08-16 DIAGNOSIS — D72819 Decreased white blood cell count, unspecified: Secondary | ICD-10-CM | POA: Diagnosis not present

## 2021-08-16 DIAGNOSIS — Z79899 Other long term (current) drug therapy: Secondary | ICD-10-CM | POA: Diagnosis not present

## 2021-08-16 DIAGNOSIS — C7951 Secondary malignant neoplasm of bone: Secondary | ICD-10-CM | POA: Diagnosis not present

## 2021-08-16 DIAGNOSIS — M25559 Pain in unspecified hip: Secondary | ICD-10-CM | POA: Diagnosis not present

## 2021-08-24 DIAGNOSIS — M160 Bilateral primary osteoarthritis of hip: Secondary | ICD-10-CM | POA: Diagnosis not present

## 2021-08-24 DIAGNOSIS — M25552 Pain in left hip: Secondary | ICD-10-CM | POA: Diagnosis not present

## 2021-08-24 DIAGNOSIS — D259 Leiomyoma of uterus, unspecified: Secondary | ICD-10-CM | POA: Diagnosis not present

## 2021-08-24 DIAGNOSIS — C50919 Malignant neoplasm of unspecified site of unspecified female breast: Secondary | ICD-10-CM | POA: Diagnosis not present

## 2021-08-24 DIAGNOSIS — W19XXXA Unspecified fall, initial encounter: Secondary | ICD-10-CM | POA: Diagnosis not present

## 2021-08-24 DIAGNOSIS — M25551 Pain in right hip: Secondary | ICD-10-CM | POA: Diagnosis not present

## 2021-08-24 DIAGNOSIS — Y92009 Unspecified place in unspecified non-institutional (private) residence as the place of occurrence of the external cause: Secondary | ICD-10-CM | POA: Diagnosis not present

## 2021-09-14 DIAGNOSIS — A419 Sepsis, unspecified organism: Secondary | ICD-10-CM | POA: Diagnosis not present

## 2021-09-14 DIAGNOSIS — R059 Cough, unspecified: Secondary | ICD-10-CM | POA: Diagnosis not present

## 2021-09-14 DIAGNOSIS — J180 Bronchopneumonia, unspecified organism: Secondary | ICD-10-CM | POA: Diagnosis not present

## 2021-09-14 DIAGNOSIS — Z20822 Contact with and (suspected) exposure to covid-19: Secondary | ICD-10-CM | POA: Diagnosis not present

## 2021-09-14 DIAGNOSIS — R5383 Other fatigue: Secondary | ICD-10-CM | POA: Diagnosis not present

## 2021-09-14 DIAGNOSIS — I10 Essential (primary) hypertension: Secondary | ICD-10-CM | POA: Diagnosis not present

## 2021-09-14 DIAGNOSIS — R778 Other specified abnormalities of plasma proteins: Secondary | ICD-10-CM | POA: Diagnosis not present

## 2021-09-14 DIAGNOSIS — R509 Fever, unspecified: Secondary | ICD-10-CM | POA: Diagnosis not present

## 2021-09-14 DIAGNOSIS — K7689 Other specified diseases of liver: Secondary | ICD-10-CM | POA: Diagnosis not present

## 2021-09-14 DIAGNOSIS — C7951 Secondary malignant neoplasm of bone: Secondary | ICD-10-CM | POA: Diagnosis not present

## 2021-09-14 DIAGNOSIS — I471 Supraventricular tachycardia: Secondary | ICD-10-CM | POA: Diagnosis not present

## 2021-09-14 DIAGNOSIS — Z853 Personal history of malignant neoplasm of breast: Secondary | ICD-10-CM | POA: Diagnosis not present

## 2021-09-14 DIAGNOSIS — R9431 Abnormal electrocardiogram [ECG] [EKG]: Secondary | ICD-10-CM | POA: Diagnosis not present

## 2021-09-14 DIAGNOSIS — R051 Acute cough: Secondary | ICD-10-CM | POA: Diagnosis not present

## 2021-09-14 DIAGNOSIS — E876 Hypokalemia: Secondary | ICD-10-CM | POA: Diagnosis not present

## 2021-09-14 DIAGNOSIS — I959 Hypotension, unspecified: Secondary | ICD-10-CM | POA: Diagnosis not present

## 2021-09-14 DIAGNOSIS — C50919 Malignant neoplasm of unspecified site of unspecified female breast: Secondary | ICD-10-CM | POA: Diagnosis not present

## 2021-09-14 DIAGNOSIS — R6520 Severe sepsis without septic shock: Secondary | ICD-10-CM | POA: Diagnosis not present

## 2021-09-15 DIAGNOSIS — Q446 Cystic disease of liver: Secondary | ICD-10-CM | POA: Diagnosis not present

## 2021-09-15 DIAGNOSIS — C50919 Malignant neoplasm of unspecified site of unspecified female breast: Secondary | ICD-10-CM | POA: Diagnosis not present

## 2021-09-15 DIAGNOSIS — R7401 Elevation of levels of liver transaminase levels: Secondary | ICD-10-CM | POA: Diagnosis not present

## 2021-09-15 DIAGNOSIS — I471 Supraventricular tachycardia: Secondary | ICD-10-CM | POA: Diagnosis not present

## 2021-09-15 DIAGNOSIS — D72810 Lymphocytopenia: Secondary | ICD-10-CM | POA: Diagnosis not present

## 2021-09-15 DIAGNOSIS — R000 Tachycardia, unspecified: Secondary | ICD-10-CM | POA: Diagnosis not present

## 2021-09-15 DIAGNOSIS — I248 Other forms of acute ischemic heart disease: Secondary | ICD-10-CM | POA: Diagnosis not present

## 2021-09-15 DIAGNOSIS — D539 Nutritional anemia, unspecified: Secondary | ICD-10-CM | POA: Diagnosis not present

## 2021-09-15 DIAGNOSIS — J189 Pneumonia, unspecified organism: Secondary | ICD-10-CM | POA: Diagnosis not present

## 2021-09-16 DIAGNOSIS — D539 Nutritional anemia, unspecified: Secondary | ICD-10-CM | POA: Diagnosis not present

## 2021-09-16 DIAGNOSIS — C50919 Malignant neoplasm of unspecified site of unspecified female breast: Secondary | ICD-10-CM | POA: Diagnosis not present

## 2021-09-16 DIAGNOSIS — I248 Other forms of acute ischemic heart disease: Secondary | ICD-10-CM | POA: Diagnosis not present

## 2021-09-16 DIAGNOSIS — Q446 Cystic disease of liver: Secondary | ICD-10-CM | POA: Diagnosis not present

## 2021-09-16 DIAGNOSIS — I4891 Unspecified atrial fibrillation: Secondary | ICD-10-CM | POA: Diagnosis not present

## 2021-09-16 DIAGNOSIS — I471 Supraventricular tachycardia: Secondary | ICD-10-CM | POA: Diagnosis not present

## 2021-09-16 DIAGNOSIS — J189 Pneumonia, unspecified organism: Secondary | ICD-10-CM | POA: Diagnosis not present

## 2021-09-16 DIAGNOSIS — R7401 Elevation of levels of liver transaminase levels: Secondary | ICD-10-CM | POA: Diagnosis not present

## 2021-09-16 DIAGNOSIS — D72810 Lymphocytopenia: Secondary | ICD-10-CM | POA: Diagnosis not present

## 2021-09-17 DIAGNOSIS — Q446 Cystic disease of liver: Secondary | ICD-10-CM | POA: Diagnosis not present

## 2021-09-17 DIAGNOSIS — D72810 Lymphocytopenia: Secondary | ICD-10-CM | POA: Diagnosis not present

## 2021-09-17 DIAGNOSIS — I248 Other forms of acute ischemic heart disease: Secondary | ICD-10-CM | POA: Diagnosis not present

## 2021-09-17 DIAGNOSIS — I471 Supraventricular tachycardia: Secondary | ICD-10-CM | POA: Diagnosis not present

## 2021-09-17 DIAGNOSIS — I4891 Unspecified atrial fibrillation: Secondary | ICD-10-CM | POA: Diagnosis not present

## 2021-09-17 DIAGNOSIS — C50919 Malignant neoplasm of unspecified site of unspecified female breast: Secondary | ICD-10-CM | POA: Diagnosis not present

## 2021-09-17 DIAGNOSIS — R7401 Elevation of levels of liver transaminase levels: Secondary | ICD-10-CM | POA: Diagnosis not present

## 2021-09-17 DIAGNOSIS — D539 Nutritional anemia, unspecified: Secondary | ICD-10-CM | POA: Diagnosis not present

## 2021-09-17 DIAGNOSIS — J189 Pneumonia, unspecified organism: Secondary | ICD-10-CM | POA: Diagnosis not present

## 2021-09-18 DIAGNOSIS — R7401 Elevation of levels of liver transaminase levels: Secondary | ICD-10-CM | POA: Diagnosis not present

## 2021-09-18 DIAGNOSIS — I440 Atrioventricular block, first degree: Secondary | ICD-10-CM | POA: Diagnosis not present

## 2021-09-18 DIAGNOSIS — Z6837 Body mass index (BMI) 37.0-37.9, adult: Secondary | ICD-10-CM | POA: Diagnosis not present

## 2021-09-18 DIAGNOSIS — J189 Pneumonia, unspecified organism: Secondary | ICD-10-CM | POA: Diagnosis not present

## 2021-09-18 DIAGNOSIS — I471 Supraventricular tachycardia: Secondary | ICD-10-CM | POA: Diagnosis not present

## 2021-09-18 DIAGNOSIS — I4891 Unspecified atrial fibrillation: Secondary | ICD-10-CM | POA: Diagnosis not present

## 2021-09-18 DIAGNOSIS — D72810 Lymphocytopenia: Secondary | ICD-10-CM | POA: Diagnosis not present

## 2021-09-18 DIAGNOSIS — D539 Nutritional anemia, unspecified: Secondary | ICD-10-CM | POA: Diagnosis not present

## 2021-09-19 DIAGNOSIS — J189 Pneumonia, unspecified organism: Secondary | ICD-10-CM | POA: Diagnosis not present

## 2021-09-19 DIAGNOSIS — D539 Nutritional anemia, unspecified: Secondary | ICD-10-CM | POA: Diagnosis not present

## 2021-09-19 DIAGNOSIS — Z6837 Body mass index (BMI) 37.0-37.9, adult: Secondary | ICD-10-CM | POA: Diagnosis not present

## 2021-09-19 DIAGNOSIS — I4891 Unspecified atrial fibrillation: Secondary | ICD-10-CM | POA: Diagnosis not present

## 2021-09-19 DIAGNOSIS — D72810 Lymphocytopenia: Secondary | ICD-10-CM | POA: Diagnosis not present

## 2021-09-19 DIAGNOSIS — R7401 Elevation of levels of liver transaminase levels: Secondary | ICD-10-CM | POA: Diagnosis not present

## 2021-09-19 DIAGNOSIS — I471 Supraventricular tachycardia: Secondary | ICD-10-CM | POA: Diagnosis not present
